# Patient Record
Sex: Female | Born: 1935 | Race: White | Hispanic: No | Marital: Married | Smoking: Never smoker
Health system: Southern US, Community
[De-identification: ages and names within clinical notes are randomized; demographics above are authoritative.]

## PROBLEM LIST (undated history)

## (undated) DIAGNOSIS — J309 Allergic rhinitis, unspecified: Secondary | ICD-10-CM

## (undated) DIAGNOSIS — I509 Heart failure, unspecified: Secondary | ICD-10-CM

## (undated) DIAGNOSIS — M810 Age-related osteoporosis without current pathological fracture: Secondary | ICD-10-CM

## (undated) DIAGNOSIS — K5790 Diverticulosis of intestine, part unspecified, without perforation or abscess without bleeding: Secondary | ICD-10-CM

## (undated) DIAGNOSIS — N390 Urinary tract infection, site not specified: Secondary | ICD-10-CM

## (undated) DIAGNOSIS — D649 Anemia, unspecified: Secondary | ICD-10-CM

## (undated) DIAGNOSIS — E785 Hyperlipidemia, unspecified: Secondary | ICD-10-CM

## (undated) HISTORY — DX: Allergic rhinitis, unspecified: J30.9

## (undated) HISTORY — DX: Diverticulosis of intestine, part unspecified, without perforation or abscess without bleeding: K57.90

## (undated) HISTORY — DX: Heart failure, unspecified: I50.9

## (undated) HISTORY — DX: Hyperlipidemia, unspecified: E78.5

## (undated) HISTORY — DX: Anemia, unspecified: D64.9

## (undated) HISTORY — PX: ROTATOR CUFF REPAIR: SHX139

## (undated) HISTORY — DX: Age-related osteoporosis without current pathological fracture: M81.0

## (undated) HISTORY — DX: Urinary tract infection, site not specified: N39.0

---

## 2004-07-23 ENCOUNTER — Ambulatory Visit: Payer: Self-pay | Admitting: Internal Medicine

## 2005-01-19 ENCOUNTER — Ambulatory Visit: Payer: Self-pay | Admitting: Internal Medicine

## 2005-09-16 ENCOUNTER — Ambulatory Visit: Payer: Self-pay | Admitting: Internal Medicine

## 2006-09-27 ENCOUNTER — Ambulatory Visit: Payer: Self-pay | Admitting: Internal Medicine

## 2008-01-24 ENCOUNTER — Ambulatory Visit: Payer: Self-pay | Admitting: Internal Medicine

## 2009-01-28 ENCOUNTER — Ambulatory Visit: Payer: Self-pay | Admitting: Internal Medicine

## 2009-04-08 ENCOUNTER — Ambulatory Visit: Payer: Self-pay | Admitting: Gastroenterology

## 2010-02-03 ENCOUNTER — Ambulatory Visit: Payer: Self-pay | Admitting: Internal Medicine

## 2011-05-18 ENCOUNTER — Ambulatory Visit: Payer: Self-pay | Admitting: Internal Medicine

## 2012-03-25 ENCOUNTER — Emergency Department: Payer: Self-pay | Admitting: Emergency Medicine

## 2012-06-20 ENCOUNTER — Ambulatory Visit: Payer: Self-pay | Admitting: Internal Medicine

## 2013-06-21 ENCOUNTER — Ambulatory Visit: Payer: Self-pay | Admitting: Family Medicine

## 2013-10-23 ENCOUNTER — Observation Stay: Payer: Self-pay | Admitting: Specialist

## 2013-10-23 LAB — URINALYSIS, COMPLETE
BLOOD: NEGATIVE
Bacteria: NONE SEEN
Bilirubin,UR: NEGATIVE
GLUCOSE, UR: NEGATIVE mg/dL (ref 0–75)
KETONE: NEGATIVE
NITRITE: NEGATIVE
PH: 7 (ref 4.5–8.0)
Protein: NEGATIVE
RBC,UR: <1 /HPF (ref 0–5)
SPECIFIC GRAVITY: 1.021 (ref 1.003–1.030)
Squamous Epithelial: 1
WBC UR: 1 /HPF (ref 0–5)

## 2013-10-23 LAB — COMPREHENSIVE METABOLIC PANEL
ALBUMIN: 3.5 g/dL (ref 3.4–5.0)
ANION GAP: 5 — AB (ref 7–16)
Alkaline Phosphatase: 68 U/L
BUN: 23 mg/dL — ABNORMAL HIGH (ref 7–18)
Bilirubin,Total: 0.4 mg/dL (ref 0.2–1.0)
Calcium, Total: 9 mg/dL (ref 8.5–10.1)
Chloride: 94 mmol/L — ABNORMAL LOW (ref 98–107)
Co2: 31 mmol/L (ref 21–32)
Creatinine: 0.63 mg/dL (ref 0.60–1.30)
EGFR (African American): >60
EGFR (Non-African Amer.): >60
GLUCOSE: 126 mg/dL — AB (ref 65–99)
Osmolality: 266 (ref 275–301)
Potassium: 3.7 mmol/L (ref 3.5–5.1)
SGOT(AST): 29 U/L (ref 15–37)
SGPT (ALT): 23 U/L (ref 12–78)
Sodium: 130 mmol/L — ABNORMAL LOW (ref 136–145)
TOTAL PROTEIN: 7.6 g/dL (ref 6.4–8.2)

## 2013-10-23 LAB — CBC
HCT: 37.6 % (ref 35.0–47.0)
HGB: 12.1 g/dL (ref 12.0–16.0)
MCH: 30.5 pg (ref 26.0–34.0)
MCHC: 32.1 g/dL (ref 32.0–36.0)
MCV: 95 fL (ref 80–100)
PLATELETS: 237 10*3/uL (ref 150–440)
RBC: 3.95 10*6/uL (ref 3.80–5.20)
RDW: 13.3 % (ref 11.5–14.5)
WBC: 13.7 10*3/uL — ABNORMAL HIGH (ref 3.6–11.0)

## 2013-10-23 LAB — LIPASE, BLOOD: Lipase: 140 U/L (ref 73–393)

## 2013-10-24 LAB — CBC WITH DIFFERENTIAL/PLATELET
BASOS PCT: 0.2 %
Basophil #: 0 10*3/uL (ref 0.0–0.1)
EOS PCT: 0.7 %
Eosinophil #: 0.1 10*3/uL (ref 0.0–0.7)
HCT: 32.8 % — ABNORMAL LOW (ref 35.0–47.0)
HGB: 11.1 g/dL — ABNORMAL LOW (ref 12.0–16.0)
LYMPHS ABS: 1.6 10*3/uL (ref 1.0–3.6)
LYMPHS PCT: 15.3 %
MCH: 31.9 pg (ref 26.0–34.0)
MCHC: 33.8 g/dL (ref 32.0–36.0)
MCV: 94 fL (ref 80–100)
MONO ABS: 1 x10 3/mm — AB (ref 0.2–0.9)
Monocyte %: 9.5 %
Neutrophil #: 7.7 10*3/uL — ABNORMAL HIGH (ref 1.4–6.5)
Neutrophil %: 74.3 %
Platelet: 217 10*3/uL (ref 150–440)
RBC: 3.49 10*6/uL — ABNORMAL LOW (ref 3.80–5.20)
RDW: 13.3 % (ref 11.5–14.5)
WBC: 10.4 10*3/uL (ref 3.6–11.0)

## 2013-10-24 LAB — BASIC METABOLIC PANEL
ANION GAP: 6 — AB (ref 7–16)
BUN: 14 mg/dL (ref 7–18)
CALCIUM: 8.1 mg/dL — AB (ref 8.5–10.1)
CHLORIDE: 101 mmol/L (ref 98–107)
CO2: 29 mmol/L (ref 21–32)
Creatinine: 0.57 mg/dL — ABNORMAL LOW (ref 0.60–1.30)
EGFR (Non-African Amer.): >60
Glucose: 96 mg/dL (ref 65–99)
OSMOLALITY: 272 (ref 275–301)
Potassium: 3.6 mmol/L (ref 3.5–5.1)
Sodium: 136 mmol/L (ref 136–145)

## 2013-10-24 LAB — MAGNESIUM: Magnesium: 1.9 mg/dL

## 2013-10-31 DIAGNOSIS — E785 Hyperlipidemia, unspecified: Secondary | ICD-10-CM | POA: Insufficient documentation

## 2013-10-31 DIAGNOSIS — K5710 Diverticulosis of small intestine without perforation or abscess without bleeding: Secondary | ICD-10-CM | POA: Insufficient documentation

## 2013-10-31 DIAGNOSIS — D649 Anemia, unspecified: Secondary | ICD-10-CM | POA: Insufficient documentation

## 2013-10-31 DIAGNOSIS — J309 Allergic rhinitis, unspecified: Secondary | ICD-10-CM | POA: Insufficient documentation

## 2013-10-31 DIAGNOSIS — M810 Age-related osteoporosis without current pathological fracture: Secondary | ICD-10-CM | POA: Insufficient documentation

## 2013-10-31 DIAGNOSIS — I509 Heart failure, unspecified: Secondary | ICD-10-CM | POA: Insufficient documentation

## 2013-11-15 ENCOUNTER — Ambulatory Visit: Payer: Self-pay | Admitting: Gastroenterology

## 2013-11-16 LAB — PATHOLOGY REPORT

## 2013-11-20 ENCOUNTER — Inpatient Hospital Stay: Payer: Self-pay | Admitting: Internal Medicine

## 2013-11-20 LAB — CBC WITH DIFFERENTIAL/PLATELET
BASOS ABS: 0 10*3/uL (ref 0.0–0.1)
Basophil %: 0.1 %
Eosinophil #: 0 10*3/uL (ref 0.0–0.7)
Eosinophil %: 0.1 %
HCT: 33 % — AB (ref 35.0–47.0)
HGB: 11.1 g/dL — ABNORMAL LOW (ref 12.0–16.0)
LYMPHS ABS: 0.9 10*3/uL — AB (ref 1.0–3.6)
Lymphocyte %: 7.2 %
MCH: 31.2 pg (ref 26.0–34.0)
MCHC: 33.6 g/dL (ref 32.0–36.0)
MCV: 93 fL (ref 80–100)
Monocyte #: 0.6 x10 3/mm (ref 0.2–0.9)
Monocyte %: 4.5 %
NEUTROS PCT: 88.1 %
Neutrophil #: 10.9 10*3/uL — ABNORMAL HIGH (ref 1.4–6.5)
Platelet: 378 10*3/uL (ref 150–440)
RBC: 3.55 10*6/uL — ABNORMAL LOW (ref 3.80–5.20)
RDW: 14 % (ref 11.5–14.5)
WBC: 12.4 10*3/uL — AB (ref 3.6–11.0)

## 2013-11-20 LAB — COMPREHENSIVE METABOLIC PANEL
ALK PHOS: 57 U/L
ANION GAP: 7 (ref 7–16)
AST: 26 U/L (ref 15–37)
Albumin: 2 g/dL — ABNORMAL LOW (ref 3.4–5.0)
BUN: 17 mg/dL (ref 7–18)
Bilirubin,Total: 0.4 mg/dL (ref 0.2–1.0)
CALCIUM: 8 mg/dL — AB (ref 8.5–10.1)
CHLORIDE: 95 mmol/L — AB (ref 98–107)
CREATININE: 0.75 mg/dL (ref 0.60–1.30)
Co2: 29 mmol/L (ref 21–32)
Glucose: 159 mg/dL — ABNORMAL HIGH (ref 65–99)
Osmolality: 268 (ref 275–301)
POTASSIUM: 3.1 mmol/L — AB (ref 3.5–5.1)
SGPT (ALT): 17 U/L (ref 12–78)
SODIUM: 131 mmol/L — AB (ref 136–145)
TOTAL PROTEIN: 6 g/dL — AB (ref 6.4–8.2)

## 2013-11-20 LAB — URINALYSIS, COMPLETE
BILIRUBIN, UR: NEGATIVE
Glucose,UR: NEGATIVE mg/dL (ref 0–75)
Nitrite: NEGATIVE
PROTEIN: NEGATIVE
Ph: 6 (ref 4.5–8.0)
RBC, UR: NONE SEEN /HPF (ref 0–5)
Specific Gravity: 1.034 (ref 1.003–1.030)
Squamous Epithelial: 2

## 2013-11-20 LAB — OCCULT BLOOD X 1 CARD TO LAB, STOOL: OCCULT BLOOD, FECES: NEGATIVE

## 2013-11-20 LAB — CLOSTRIDIUM DIFFICILE(ARMC)

## 2013-11-21 LAB — CBC WITH DIFFERENTIAL/PLATELET
Basophil #: 0 10*3/uL (ref 0.0–0.1)
Basophil %: 0.1 %
EOS ABS: 0 10*3/uL (ref 0.0–0.7)
EOS PCT: 0.3 %
HCT: 27 % — ABNORMAL LOW (ref 35.0–47.0)
HGB: 9.1 g/dL — ABNORMAL LOW (ref 12.0–16.0)
Lymphocyte #: 0.9 10*3/uL — ABNORMAL LOW (ref 1.0–3.6)
Lymphocyte %: 9.2 %
MCH: 31.3 pg (ref 26.0–34.0)
MCHC: 33.7 g/dL (ref 32.0–36.0)
MCV: 93 fL (ref 80–100)
MONO ABS: 0.5 x10 3/mm (ref 0.2–0.9)
MONOS PCT: 5.5 %
Neutrophil #: 8.3 10*3/uL — ABNORMAL HIGH (ref 1.4–6.5)
Neutrophil %: 84.9 %
Platelet: 301 10*3/uL (ref 150–440)
RBC: 2.91 10*6/uL — AB (ref 3.80–5.20)
RDW: 14.1 % (ref 11.5–14.5)
WBC: 9.7 10*3/uL (ref 3.6–11.0)

## 2013-11-21 LAB — BASIC METABOLIC PANEL
Anion Gap: 7 (ref 7–16)
BUN: 11 mg/dL (ref 7–18)
Calcium, Total: 7.4 mg/dL — ABNORMAL LOW (ref 8.5–10.1)
Chloride: 101 mmol/L (ref 98–107)
Co2: 27 mmol/L (ref 21–32)
Creatinine: 0.64 mg/dL (ref 0.60–1.30)
EGFR (African American): >60
Glucose: 81 mg/dL (ref 65–99)
Osmolality: 269 (ref 275–301)
Potassium: 3.1 mmol/L — ABNORMAL LOW (ref 3.5–5.1)
SODIUM: 135 mmol/L — AB (ref 136–145)

## 2013-11-21 LAB — TSH: THYROID STIMULATING HORM: 0.307 u[IU]/mL — AB

## 2013-11-23 LAB — CBC WITH DIFFERENTIAL/PLATELET
Bands: 16 %
COMMENT - H1-COM2: NORMAL
COMMENT - H1-COM3: NORMAL
EOS PCT: 1 %
HCT: 31.5 % — ABNORMAL LOW (ref 35.0–47.0)
HGB: 10.4 g/dL — ABNORMAL LOW (ref 12.0–16.0)
LYMPHS PCT: 8 %
MCH: 30.7 pg (ref 26.0–34.0)
MCHC: 32.9 g/dL (ref 32.0–36.0)
MCV: 93 fL (ref 80–100)
MONOS PCT: 1 %
MYELOCYTE: 7 %
Metamyelocyte: 6 %
Platelet: 337 10*3/uL (ref 150–440)
Promyelocyte: 1 %
RBC: 3.37 10*6/uL — ABNORMAL LOW (ref 3.80–5.20)
RDW: 14.2 % (ref 11.5–14.5)
Segmented Neutrophils: 60 %
WBC: 4.9 10*3/uL (ref 3.6–11.0)

## 2013-11-23 LAB — BASIC METABOLIC PANEL
Anion Gap: 6 — ABNORMAL LOW (ref 7–16)
BUN: 6 mg/dL — AB (ref 7–18)
Calcium, Total: 7.7 mg/dL — ABNORMAL LOW (ref 8.5–10.1)
Chloride: 104 mmol/L (ref 98–107)
Co2: 26 mmol/L (ref 21–32)
Creatinine: 0.52 mg/dL — ABNORMAL LOW (ref 0.60–1.30)
EGFR (African American): >60
Glucose: 147 mg/dL — ABNORMAL HIGH (ref 65–99)
Osmolality: 272 (ref 275–301)
Potassium: 4.8 mmol/L (ref 3.5–5.1)
Sodium: 136 mmol/L (ref 136–145)

## 2013-11-23 LAB — STOOL CULTURE

## 2013-11-26 LAB — T4, FREE: Free Thyroxine: 1.28 ng/dL (ref 0.76–1.46)

## 2013-12-20 ENCOUNTER — Inpatient Hospital Stay: Payer: Self-pay | Admitting: Internal Medicine

## 2013-12-20 LAB — CBC WITH DIFFERENTIAL/PLATELET
Basophil #: 0 10*3/uL (ref 0.0–0.1)
Basophil %: 0.1 %
Eosinophil #: 0 10*3/uL (ref 0.0–0.7)
Eosinophil %: 0.2 %
HCT: 28.7 % — ABNORMAL LOW (ref 35.0–47.0)
HGB: 9.4 g/dL — ABNORMAL LOW (ref 12.0–16.0)
LYMPHS ABS: 1.3 10*3/uL (ref 1.0–3.6)
LYMPHS PCT: 9.1 %
MCH: 30.1 pg (ref 26.0–34.0)
MCHC: 32.6 g/dL (ref 32.0–36.0)
MCV: 92 fL (ref 80–100)
Monocyte #: 1 x10 3/mm — ABNORMAL HIGH (ref 0.2–0.9)
Monocyte %: 6.9 %
NEUTROS PCT: 83.7 %
Neutrophil #: 12.4 10*3/uL — ABNORMAL HIGH (ref 1.4–6.5)
Platelet: 470 10*3/uL — ABNORMAL HIGH (ref 150–440)
RBC: 3.11 10*6/uL — ABNORMAL LOW (ref 3.80–5.20)
RDW: 15.1 % — ABNORMAL HIGH (ref 11.5–14.5)
WBC: 14.8 10*3/uL — ABNORMAL HIGH (ref 3.6–11.0)

## 2013-12-20 LAB — COMPREHENSIVE METABOLIC PANEL
AST: 22 U/L (ref 15–37)
Albumin: 2 g/dL — ABNORMAL LOW (ref 3.4–5.0)
Alkaline Phosphatase: 71 U/L
Anion Gap: 4 — ABNORMAL LOW (ref 7–16)
BUN: 31 mg/dL — ABNORMAL HIGH (ref 7–18)
Bilirubin,Total: 0.3 mg/dL (ref 0.2–1.0)
CHLORIDE: 97 mmol/L — AB (ref 98–107)
Calcium, Total: 8.8 mg/dL (ref 8.5–10.1)
Co2: 32 mmol/L (ref 21–32)
Creatinine: 0.87 mg/dL (ref 0.60–1.30)
EGFR (Non-African Amer.): >60
GLUCOSE: 111 mg/dL — AB (ref 65–99)
Osmolality: 274 (ref 275–301)
Potassium: 3.8 mmol/L (ref 3.5–5.1)
SGPT (ALT): 18 U/L (ref 12–78)
Sodium: 133 mmol/L — ABNORMAL LOW (ref 136–145)
Total Protein: 6.1 g/dL — ABNORMAL LOW (ref 6.4–8.2)

## 2013-12-20 LAB — CLOSTRIDIUM DIFFICILE(ARMC)

## 2013-12-20 LAB — PROTIME-INR
INR: 1.1
PROTHROMBIN TIME: 14.1 s (ref 11.5–14.7)

## 2013-12-21 LAB — BASIC METABOLIC PANEL
Anion Gap: 8 (ref 7–16)
BUN: 23 mg/dL — ABNORMAL HIGH (ref 7–18)
CALCIUM: 7.8 mg/dL — AB (ref 8.5–10.1)
CREATININE: 0.61 mg/dL (ref 0.60–1.30)
Chloride: 97 mmol/L — ABNORMAL LOW (ref 98–107)
Co2: 26 mmol/L (ref 21–32)
EGFR (African American): >60
EGFR (Non-African Amer.): >60
Glucose: 177 mg/dL — ABNORMAL HIGH (ref 65–99)
Osmolality: 271 (ref 275–301)
Potassium: 3.7 mmol/L (ref 3.5–5.1)
Sodium: 131 mmol/L — ABNORMAL LOW (ref 136–145)

## 2013-12-21 LAB — CBC WITH DIFFERENTIAL/PLATELET
Basophil #: 0 10*3/uL (ref 0.0–0.1)
Basophil %: 0.1 %
Eosinophil #: 0 10*3/uL (ref 0.0–0.7)
Eosinophil %: 0 %
HCT: 26.2 % — ABNORMAL LOW (ref 35.0–47.0)
HGB: 8.6 g/dL — ABNORMAL LOW (ref 12.0–16.0)
Lymphocyte #: 0.6 10*3/uL — ABNORMAL LOW (ref 1.0–3.6)
Lymphocyte %: 4.2 %
MCH: 30.2 pg (ref 26.0–34.0)
MCHC: 32.9 g/dL (ref 32.0–36.0)
MCV: 92 fL (ref 80–100)
Monocyte #: 0.4 x10 3/mm (ref 0.2–0.9)
Monocyte %: 2.7 %
Neutrophil #: 14.2 10*3/uL — ABNORMAL HIGH (ref 1.4–6.5)
Neutrophil %: 93 %
Platelet: 403 10*3/uL (ref 150–440)
RBC: 2.86 10*6/uL — ABNORMAL LOW (ref 3.80–5.20)
RDW: 15.1 % — ABNORMAL HIGH (ref 11.5–14.5)
WBC: 15.3 10*3/uL — ABNORMAL HIGH (ref 3.6–11.0)

## 2013-12-21 LAB — MAGNESIUM: Magnesium: 1.9 mg/dL

## 2013-12-21 LAB — TSH: Thyroid Stimulating Horm: 0.247 u[IU]/mL — ABNORMAL LOW

## 2013-12-21 LAB — T4, FREE: Free Thyroxine: 1.35 ng/dL (ref 0.76–1.46)

## 2013-12-22 LAB — STOOL CULTURE

## 2013-12-25 LAB — CULTURE, BLOOD (SINGLE)

## 2014-03-16 DIAGNOSIS — K5730 Diverticulosis of large intestine without perforation or abscess without bleeding: Secondary | ICD-10-CM | POA: Insufficient documentation

## 2014-08-21 ENCOUNTER — Ambulatory Visit: Payer: Self-pay | Admitting: Family Medicine

## 2014-11-02 NOTE — Consult Note (Signed)
PATIENT NAME:  Carrie [name], Carrie [name] MR#:  [id number] DATE OF BIRTH:  Jun 27, 1930  DATE OF CONSULTATION:  12/21/2013  CONSULTING PHYSICIAN:  Loraine LericheMark A. Egbert GaribaldiBird, MD  HISTORY OF PRESENT ILLNESS:  This is an 81 year old [place] female recently diagnosed with nonspecific colitis approximately 2 months ago. She has been in the hospital now 3 times with recalcitrant colitis, been treated with intravenous antibiotics, intravenous steroids and oral steroids. Several colonoscopies have been performed, which demonstrate nonspecific-appearing colitis. Pathology is consistent with colitis. She has been treated presumptively for ulcerative colitis. The patient had then been readmitted to the hospital yesterday with recurrent abdominal pain and failure of outpatient therapy. A CT scan was performed which demonstrates persistent left-sided colitis, as well as pericolonic abscess. Surgical services were asked to comment regarding surgical therapy.  Allergies and medications are well defined in the chart.   PAST MEDICAL HISTORY: Diverticulosis, possible colitis, possible ulcerative colitis, arthritis, restless leg syndrome, osteoporosis, osteoarthritis.   ALLERGIES: AZITHROMYCIN AND NITROFURANTOIN.   SOCIAL HISTORY: Does not drink, does not smoke. No drug use. Is married. Family is in the room.   FAMILY HISTORY:  Significant for hypertension, diabetes and cancer.   REVIEW OF SYSTEMS: Significant for a 10 pound weight loss, poor p.o. intake, fatigue, abdominal pain, chronic blurry vision, depression, failure to thrive, as well as bloody diarrhea.   PHYSICAL EXAMINATION: GENERAL:  This is an ill-appearing [place] female in no apparent distress.  VITAL SIGNS: Temperature is 98.6, pulse of 101. Blood pressure is 146/76, O2 sat on room air is 96%.  LUNGS:  Clear.  HEART:  Regular rate and rhythm.  ABDOMEN:  Tender in the left lower quadrant, mildly distended. No scars. No hernias. No masses.  RECTAL:  Deferred.  NEUROLOGIC AND  PSYCHIATRIC:  Unremarkable.   LABORATORY VALUES:  Glucose 177, BUN 23, creatinine 0.61, sodium 131, potassium 3.7, chloride 97, CO2 of 26, magnesium 1.9. TSH is 0.247. Kimbrell count is 15.3, hemoglobin 8.6, platelet count 403,000 with left shift. Urinalysis not obtained. Blood cultures are negative. C. diff is negative. Review of CT scan is as described above. Previous CT scans demonstrate left-sided colitis.   IMPRESSION: Left-sided colitis with contained perforation. It is unclear to me what the perforation is coming from, either from necrotic segment of colon or diverticular disease. Clearly, the patient has failed medical therapy. There is no indication at this portion for Remicade, if, indeed, this is even going to be treated for ulcerative colitis. Further steroid therapy in the setting of an abscess would be, in my mind, a moderate contraindication.   RECOMMENDATIONS: I discussed with the family, as well as gastroenterology, the possibility of a diverting colostomy with resection, i.e., Hartmann procedure.  I will let the family discuss this amongst themselves, and I will see the patient in the morning.   total time spent 65 minutes.  ____________________________ Redge GainerMark A. Egbert GaribaldiBird, MD mab:dmm D: 12/21/2013 17:17:15 ET T: 12/21/2013 19:07:52 ET JOB#: [id number]  cc: Loraine LericheMark A. Egbert GaribaldiBird, MD, <Dictator> cc Dr Renae Ficklepaul Barrington Ellisonh, [facility]. Burnice Vassel A Tersea Aulds MD ELECTRONICALLY SIGNED 12/23/2013 11:52

## 2014-11-02 NOTE — Consult Note (Signed)
PATIENT NAME:  Carrie [name], Marvis K MR#:  [id number] DATE OF BIRTH:  1930/07/11  DATE OF CONSULTATION:  10/24/2013  REFERRING PHYSICIAN:   CONSULTING PHYSICIAN:  Rodman Keyawn S. Harrison, NP  PRIMARY CARE PHYSICIAN:  Kandyce RudMarcus Babaoff, MD  ATTENDING:  Shaune PollackQing Chen, MD  REASON FOR CONSULTATION: Diverticulitis.   HISTORY OF PRESENT ILLNESS:  Carrie [name] is a very pleasant 81 year old Caucasian female with a history of diverticulosis, arthritis. [facility] on yesterday's date with a 3-day history of abdominal pain, pain to the right lower quadrant of her abdomen intermittent, achy in description. Denies radiating anywhere. Her pain was a 5 on a scale of 1 to 10. No nausea, no vomiting. Significant for diarrhea. States diarrhea started yesterday and did experience some evidence of mild rectal bleeding which she felt was possibly related to hemorrhoids. Normally, her bowels move on average every day. No melena. Abdominal pain has resolved at this time since being admitted and started on antibiotic therapy. She did have a CT scan of abdomen and pelvis which was done yesterday evening, which revealed a tubular structure posterior to the cecum, thought to represent an appendix measuring 6 mm in diameter. The cecum and right colon were mildly distended and there were scattered colonic diverticula more concentrated in the sigmoid colon where there is also a region of colon wall thickening. The colon vicinity seems abnormally thick, greater than would be expected for diverticulosis. No adnexal mass was observed. The uterus is unremarkable. There is a trace of free pelvic fluid and a small amount of gas in the urinary bladder noted. Also evidence of lumbar spondylosis and degenerative disk disease. She has been experiencing some heartburn and does experience heartburn at home. No fevers. No nausea, no vomiting. Appetite has been decreased for the past week prior to admission. She did have a colonoscopy which was  performed by Dr. Lutricia FeilPaul Oh on 04/08/2009 with evidence of diverticulosis involving sigmoid colon, evidence of nonthrombosed internal hemorrhoids.   ALLERGIES: ZITHROMAX AND MACROBID.   HOME MEDICATIONS: Percocet 325/5 mg 1 tablet twice a day, aspirin 81 mg a day, calcium 600 mg twice a day, Centrum silver multivitamin once a day, Zyrtec 10 mg once a day as needed for allergies, cranberry capsules 6 capsules once a day, Evista 60 mg once a day, meloxicam 15 mg once a day, pramipexole 0.25 mg at bedtime, Robaxin 500 mg 4 times daily as needed, vitamin C 500 mg daily, vitamin E 400 international units 2 capsules once a day.   PAST MEDICAL HISTORY: Diverticulosis, arthritis. Congestive heart failure secondary to severe gram-negative pneumonia. She also had some mild systolic dysfunction June 1996. History of mild systolic dysfunction by echocardiogram at that time. Perennial allergic rhinitis, history of monocystic normochromic anemia, mild; hyperlipidemia, corneal disease and mild osteoporosis.   PAST SURGICAL HISTORY: Bilateral cataracts implantation, a cyst removal from finger, colonoscopy in 2010 and right rotator cuff 2006.   FAMILY HISTORY: Mom oral mouth cancer. Sister lung cancer. Brother lung cancer. Uncle stomach cancer.   SOCIAL HISTORY: No tobacco. No alcohol use.   REVIEW OF SYSTEMS: All 10 systems reviewed and checked, otherwise unremarkable other than what is stated above except for the fact that she has suffered a traumatic injury to her right shoulder a few weeks ago is concerned with possible reinjury of rotator cuff.   PHYSICAL EXAMINATION: VITAL SIGNS: Temperature 98.7, pulse 96, respirations 18, blood pressure 118/69 with a pulse ox 97% on room air.  GENERAL: Well-developed,  well-nourished 81 year old Caucasian female, no acute distress noted. Pleasant, in fact appearing very comfortably sitting in bed, normal clothes. Family and friends at bedside.  HEENT: Normocephalic,  atraumatic. Pupils equal, reactive to light. Conjunctivae clear. Sclerae anicteric.  NECK: Supple. Trachea midline. No lymphadenopathy or thyromegaly.  PULMONARY: Symmetric rise and fall of chest. Clear to auscultation throughout.  CARDIOVASCULAR: Regular rhythm, S1, S2. No murmurs, no gallops.  ABDOMEN: Soft, nondistended. Bowel sounds in all quadrants. No bruits. No masses. No evidence of hepatosplenomegaly.  RECTAL: Deferred.  MUSCULOSKELETAL: Moving all 4 extremities. No contractures. No clubbing.  EXTREMITIES: No edema.  PSYCHIATRIC: Alert and oriented x 4. Memory grossly intact. Appropriate affect and mood.  SKIN: Warm, dry. No lesions. No rashes.   LABORATORY, DIAGNOSTIC AND RADIOLOGICAL DATA: Glucose was elevated at 126 on admission, BUN was 23, sodium 130 and corrected to 136, chloride 94 and corrected to 101 at this time. Hepatic panel within normal limits. CBC: WBC count was elevated at 13.7 on admission, hemoglobin was 12.1 with hematocrit of 37.6 which now appears to be mildly anemic at 11.1 and hematocrit of 32.8. Urinalysis within normal limits. Lactic acid level was 1.3. CT scan results as stated above.   IMPRESSION:  Acute onset of right lower quadrant abdominal pain, abnormal CT scan findings inclusive of cecum as well as sigmoid colonic area. Diagnosed with acute diverticulitis. Known history of diverticulosis.   PLAN: The patient's presentation was discussed with Dr. Lutricia Feil. The patient placed on clear liquid diet at this time after he had spoken with Dr. Cherlynn Kaiser as well. She is currently receiving antibiotic therapy inclusive of ciprofloxacin and metronidazole. Plan is for the patient to be discharged on antibiotic therapy if her prognosis but continues to improve. Goal is for the patient to be discharged tomorrow for her to follow up on an outpatient basis and to discuss at that time proceeding forward with a diagnostic colonoscopy based on CT scan findings. This was discussed  with the patient and the patient is in agreement to proceed forward in this manner. We will continue to monitor.   These services provided by Rodman Key, MS, APRN, [facility], FNP, under collaborative agreement with Dr. Lutricia Feil.    ____________________________ Rodman Key, NP dsh:cs D: 10/24/2013 13:50:00 ET T: 10/24/2013 14:07:17 ET JOB#: [id number]  cc: Rodman Key, NP, <Dictator> Rodman Key MD ELECTRONICALLY SIGNED 10/24/2013 16:49

## 2014-11-02 NOTE — Consult Note (Signed)
Pt depressed and teary eyed. Mild tenderness in LLQ abd area. Pt to have surgery tomorrow AM. Since patient has abscess, would taper off IV steroids. Continue IV Abx. Will check back after surgery. thanks.  Electronic Signatures: Lutricia Feilh, Cordell Coke (MD)  (Signed on 13-Jun-15 10:05)  Authored  Last Updated: 13-Jun-15 10:05 by Lutricia Feilh, Aziyah Provencal (MD)

## 2014-11-02 NOTE — Discharge Summary (Signed)
PATIENT NAME:  Carrie [name], Carrie [name] MR#:  [id number] DATE OF BIRTH:  1929/09/25  DATE OF ADMISSION:  11/20/2013 DATE OF DISCHARGE:  11/26/2013  DISCHARGE DIAGNOSES:  1. Acute colitis, now improving, tolerating diet.  2. Hypokalemia, repleted and resolved.   SECONDARY DIAGNOSES:  1. History of diverticulitis.  2. Arthritis.  3. Restless leg syndrome.  4. Osteoporosis.  5. Osteoarthritis.   CONSULTATIONS:  1. GI, Dr. Lutricia Feil.  2. Physical therapy.   PROCEDURES AND RADIOLOGY: CT scan of the abdomen and pelvis with contrast on the 12th of May showed colitis. Contracted gallbladder without calcified gallstone. Dilated common bile duct without pancreatic mass. Splenic artery aneurysm measuring up to 1.7 cm.   Chest x-ray on the 14th of May showed small bilateral pleural effusion. Repeat chest x-ray was unchanged with small bilateral pleural effusions and bibasilar opacities, likely atelectasis.   MAJOR LABORATORY PANEL:  1. UA admission was negative.  2. Stool for C. difficile was negative. Stool comprehensive culture was negative.  3. Hemoccult stool was negative.  4. Free T4 was within normal limits with a value of 1.28. Serum free T3 was low with a value of 1.3. TSH was low with a value of 0.307.   HISTORY AND SHORT HOSPITAL COURSE: The patient is an 81 year old female with above-mentioned medical problems who was admitted for abdominal pain. Underwent CT scan of the abdomen which showed possible colitis. Please see Dr. Serita Grit Patel's dictated history and physical for further details. GI consultation was obtained with Dr. Bluford Kaufmann who recommended continuing antibiotics IV form and starting IV steroids concerning history of ulcerative colitis. The patient was slowly improving on above-mentioned regimen and was able to tolerate diet, and her abdominal pain was significantly better by the 18th of May. She had some low potassium which was repleted and resolved. She was close to her baseline on the 18th of  May and was discharged home in stable condition.   VITAL SIGNS: On the date of discharge, her vital signs were as follows: Temperature 98.8, heart rate 95 per minute, respirations 20 per minute, blood pressure 122/72 mmHg. She was saturating 93% on room air.   PERTINENT PHYSICAL EXAMINATION ON THE DATE OF DISCHARGE:  CARDIOVASCULAR: S1, S2 normal. No murmurs, rubs or gallop.  LUNGS: Clear to auscultation bilaterally. No wheezing, rales, rhonchi or crepitation.  ABDOMEN: Soft, benign.  NEUROLOGIC: Nonfocal examination.   All other physical examination remained at baseline.   DISCHARGE MEDICATIONS:  1. Pramipexole 0.25 mg p.o. once at bedtime.  2. Meloxicam 15 mg p.o. daily.  3. Calcium with vitamin D 1 tablet p.o. b.i.d.  4. Centrum Silver once daily.  5. Cetirizine 10 mg p.o. daily as needed.  6. Evista 60 mg p.o. daily.  7. Vitamin E 400 international units 2 capsules daily.  8. Vitamin C 500 mg p.o. daily.  9. Robaxin 500 mg p.o. 4 times a day as needed.  10. Acetaminophen/hydrocodone 325/5 mg 1 tablet p.o. b.i.d.  11. Cranberry 2 capsules p.o. after each meal.  12. Muro 128 one drop to each affected eye 4 times a day.  13. Flagyl 500 mg p.o. every 8 hours for 10 days.  14. Prednisone 60 mg p.o. daily, taper 10 mg each week until finished.  15. Ciprofloxacin 500 mg p.o. b.i.d. for 10 days.   DISCHARGE DIET: Low fat, low cholesterol. Eat light for the first few meals.   DISCHARGE ACTIVITY: As tolerated.   DISCHARGE INSTRUCTIONS AND FOLLOWUP:  1. The patient was instructed  to follow up with her primary care physician, Dr. Larwance SachsBabaoff, [facility] as scheduled on the 29th of May.  2. She will follow up with Dr. Lutricia FeilPaul Oh from GI in 2 to 3 weeks.   TOTAL TIME DISCHARGING THIS PATIENT: 55 minutes.   ____________________________ Ellamae SiaVipul S. Sherryll BurgerShah, MD vss:gb D: 11/29/2013 19:30:07 ET T: 11/30/2013 00:53:08 ET JOB#: [id number]  cc: Inessa Wardrop S. Sherryll BurgerShah, MD, <Dictator> Kandyce RudMarcus Babaoff,  MD Ezzard StandingPaul Y. Bluford Kaufmannh, MD Patricia PesaVIPUL S Jamee Keach MD ELECTRONICALLY SIGNED 12/04/2013 04:5420:08

## 2014-11-02 NOTE — H&P (Signed)
PATIENT NAME:  Carrie [name], Carrie [name] MR#:  [id number] DATE OF BIRTH:  [birth year]  DATE OF ADMISSION:  11/20/2013  PRIMARY CARE PROVIDER: Dr. Baldemar Lenis   ED REFERRING PHYSICIAN: Dr. Jimmye Norman  CHIEF COMPLAINT: Abdominal pain.    HISTORY OF PRESENT ILLNESS: The patient is an 81 year old [place] female who was hospitalized on April 14 with the diagnosis of acute diverticulitis. At that time, she was treated with IV antibiotics and subsequently discharged home. Then she was seen by outpatient GI, and on May 7 underwent a colonoscopy, which was limited up to only the sigmoid area due to significant inflammation in her rectum. The patient was told to be treated with Flagyl and Asacol. The pathology results from that showed acute colitis. She presented with worsening abdominal pain. A CT scan done today shows significant inflammation involving the rectum and the colon. Therefore, we were asked to admit the patient. The patient reports that her appetite has been okay. She has not had any nausea, vomiting or diarrhea. Denies any hematemesis or hematochezia. The pain is mostly located in the left lower quadrant, described as a sharp type of pain. She denies any fevers or chills.   PAST MEDICAL HISTORY: History of diverticulitis, history of arthritis, history of restless legs syndrome, osteoporosis, osteoarthritis.  ALLERGIES: AZITHROMYCIN, NITROFURANTOIN.  MEDICATIONS AT HOME: Vitamin E 4000 international units 2 caps daily, vitamin C 500 mg 1 tab p.o. daily, Robaxin 509, 1 tab 4 times a day, pramipexole 0.25 at bedtim1e, Miro 128, 1 drop to affected eye daily, Flagyl 500 mg 1 tab p.o. q. 8, meloxicam 15 mg daily, Evista 60 mg 1 tab p.o. daily, cranberry 6 caps daily, cetirizine 10 daily as needed, Centrum Silver 1 tab p.o. daily, calcium Plus Vitamin D 1 tab p.o. b.i.d., Asacol 800, 2 tabs t.i.d., acetaminophen/ hydrocodone 325/5 mg 1 tab p.o. b.i.d. as needed for pain.   SOCIAL HISTORY: No smoking, drinking, or illicit  drug use.   FAMILY HISTORY: Positive for hypertension, diabetes.  REVIEW OF SYSTEMS:   CONSTITUTIONAL: Denies any fevers. Complains of fatigue, weakness, abdominal pain. No weight loss. No weight gain.  EYES: No blurred or double vision. No pain. No redness or inflammation.  ENT: No tinnitus. No ear pain. No hearing loss. No allergies, seasonal or year-round allergies. No difficulty with swallowing.  RESPIRATORY: Denies any cough, wheezing, hemoptysis. No dyspnea.  CARDIOVASCULAR: Denies any chest pain, orthopnea, edema, or arrhythmia.  GASTROINTESTINAL: Denies any nausea, vomiting or diarrhea. Complains of abdominal pain in the left lower quadrant. No hematemesis. No melena.  GENITOURINARY: Denies any dysuria, hematuria, renal calculus or frequency.  ENDOCRINE: Denies any polydipsia or nocturia or thyroid problems.  HEMATOLOGIC/LYMPHATIC: Denies anemia, easy bruisability, or bleeding.  SKIN: No acne or rash. No changes in mole, hair or skin.  MUSCULOSKELETAL: Has osteoarthritis and pain related to that. No gout.  NEUROLOGIC: No numbness, weakness. Has restless legs syndrome.  PSYCHIATRIC: No anxiety, insomnia.  PHYSICAL EXAMINATION: VITAL SIGNS: Temperature 98.1, pulse 100, respirations 18, blood pressure 121/53, O2 of 97%.  GENERAL: The patient is a well-developed female in no acute distress.  HEENT: Head atraumatic, normocephalic. Pupils equally round, react to light and accommodation. There is no conjunctival pallor. No scleral icterus. Nasal exam shows no drainage or ulceration. Oropharynx is clear, without any exudate.  NECK: Supple, without any JVD.  CARDIOVASCULAR: Regular rate and rhythm. No murmurs, rubs, clicks, or gallops.  LUNGS: Clear to auscultation bilaterally without any rales, rhonchi, wheezing.  ABDOMEN: Left lower quadrant tenderness.  No guarding or rebound.  EXTREMITIES: No clubbing, cyanosis, edema.  SKIN: No rash.  LYMPHATICS: No lymph nodes palpable.  VASCULAR:  Good DP, PT pulses.  PSYCHIATRIC: Not anxious or depressed.  NEUROLOGICAL: Awake, alert, oriented x 3. No focal deficits.   EVALUATIONS: CT scan of the abdomen and pelvis shows significant inflammation of the descending colon and sigmoid colon. No abscess. Labs:  Glucose 159, BUN 17, creatinine 0.75, sodium 131, potassium 3.1, chloride 95, CO2 is 29, calcium 8.0. LFTs: Total protein 6.0, albumin 2.0, bili total 0.4, alk phos 57, AST 26, ALT 17. WBC 12.4, hemoglobin 11.1, platelet count 378. Urinalysis:  Leukocytes 1+, nitrites negative.   ASSESSMENT AND PLAN: The patient is an 81 year old [place] female with recent admission for possible diverticulitis. Has severe inflammation involving her rectum and colon.   1.  Abdominal pain due to colitis. Has been on Cipro and Flagyl. At this time, I will treat her with Invanz. Will try to send stool if she had diarrhea, but currently no diarrhea. Also, she is on Asacol for possible inflammatory bowel disease, which will continue that. Will have GI consult on the patient for further recommendations.  2.  Hypokalemia. Will place potassium in her IV fluids.   3.  Miscellaneous. The patient will be on heparin for deep vein thrombosis prophylaxis.  TIME SPENT: 50 minutes on this H and P.    ____________________________ Chana Bode H. Posey Pronto, MD shp:mr D: 11/20/2013 19:42:08 ET T: 11/20/2013 20:22:09 ET JOB#: [id number]  cc: Laval Cafaro H. Posey Pronto, MD, <Dictator> Alric Seton MD ELECTRONICALLY SIGNED 11/26/2013 12:13

## 2014-11-02 NOTE — Consult Note (Signed)
Carrie [name] NAME:  Carrie Carrie [name], Carrie Carrie [name] MR#:  [id number] DATE OF BIRTH:  [birth year]  DATE OF CONSULTATION:  11/21/2013  REFERRING PHYSICIAN:  Dr. Eliane Decree CONSULTING PHYSICIAN:  Carrie Carrie [name]. Carrie Kaufmann, MD; Carrie Key, NP PRIMARY CARE PHYSICIAN: [name], MD  REASON FOR CONSULTATION: Colitis.   HISTORY OF PRESENT ILLNESS: Carrie Carrie [name] is an 81 year old Caucasian female who is known to myself as well as Dr. Lutricia Feil. Carrie Carrie [name] was hospitalized on February 22, 2014, for Carrie diagnosis of acute diverticulitis. At that time, she was treated with IV antibiotics and subsequently was discharged home. She underwent endoscopic evaluation by Dr. Bluford Carrie [name] via colonoscopy on Nov 15, 2013, with finding of many small largemouth diverticula found in Carrie sigmoid colon. Diffuse severe inflammation characterized by erythema and friability was found in Carrie rectum. Biopsies were taken with cold forceps for histology. Results revealed evidence of moderate active colitis in Carrie form of cryptitis and crypt abscess formation. Chronicity was not present. Differential inclusive of infectious, ischemic, inflamed diverticula; drug-induced or early idiopathic inflammatory bowel disease. Based on Carrie findings of pathology, Carrie Carrie [name] was prescribed ciprofloxacin 500 mg p.o. b.i.d. as well as metronidazole 500 mg t.i.d. this past Friday on an outpatient basis, as well as started on Asacol HD 800 mg tablet with directions of 2 tablets p.o. t.i.d. She was to notify our office Wednesday of this week, i.e. today. Carrie Carrie [name]'s husband had notified our office yesterday of her symptoms being worse. Abdominal pain was not improving, was at least a 6 on a scale of 1 to 10. Pain was sharp in nature. She was with very poor p.o. intake. Associated nausea with gagging at times. Denies any fevers and no rectal bleeding. Carrie Carrie [name] was recommended thus to present to Carrie Emergency Room for Carrie concern of dehydration on top of Carrie findings of colitis, which appear to have  not been improving with outpatient antibiotic therapy. Thus, she presented yesterday evening and obviously admitted.   PAST MEDICAL HISTORY: Diverticulitis, history of arthritis, restless leg syndrome, osteoporosis and osteoarthritis.   ALLERGIES: ERYTHROMYCIN AND MACRODANTIN.   HOME MEDICATIONS: Vitamin E 1000 international units 2 capsules daily, vitamin C 500 mg 1 tablet p.o. b.i.d., Robaxin 500 mg 1 tablet 4 times a day, pramipexole 0.25 mg at bedtime, Muro 128 one drop to each affected eye daily, metronidazole 500 mg 1 tablet p.o. t.i.d., meloxicam 15 mg daily, Evista 60 mg once a day, cranberry 6 capsules daily, Zyrtec 10 mg 1 tablet daily as needed, Centrum Silver 1 tablet daily, calcium with vitamin D 1 tablet twice a day, Asacol HD 800 mg 2 tablets t.i.d., acetaminophen with hydrocodone 5/325 mg tablet 1 tablet b.i.d. as needed for pain.   SOCIAL HISTORY: No tobacco. No alcohol or recreational drug use.   FAMILY HISTORY: Significant for hypertension, diabetes.   REVIEW OF SYSTEMS: CONSTITUTIONAL: Significant for fatigue, weakness. Denies any fevers, weight loss, weight gain.  EYES: No blurred vision, double vision.  ENT: No tinnitus, ear pain, hearing loss.  RESPIRATORY: No coughing. No wheezing, hemoptysis.  CARDIOVASCULAR: No chest pain, heart palpitations.  GASTROINTESTINAL: See HPI.  GENITOURINARY: No dysuria or hematuria.  ENDOCRINE: No polydipsia or nocturia.  HEMATOLOGIC AND LYMPHATIC: No history of anemia, easy bruising and bleeding.  SKIN: No rashes. No lesions.  MUSCULOSKELETAL: No arthralgias, myalgias.  NEUROLOGIC: No history of CVA or TIA. Significant for history of restless leg syndrome.  PSYCHIATRIC: No depression. No anxiety.   PHYSICAL EXAMINATION: VITAL SIGNS: Temperature is 99. Pulse  is 107. Respirations are 20. Blood pressure is 142/68, and pulse oximetry is 93% on room air.  GENERAL: Well-developed, well-nourished, ill-appearing 81 year old Caucasian female.   HEENT: Normocephalic, atraumatic. Pupils equal, reactive to light. Conjunctivae clear. Sclerae anicteric.  NECK: Supple. Trachea midline. No lymphadenopathy or thyromegaly.  PULMONARY: Symmetric rise and fall of chest. Clear to auscultation throughout.  CARDIOVASCULAR: Regular rhythm, S1, S2. No murmurs, no gallops.  ABDOMEN: Distended. Bowel sounds in 4 quadrants, high-pitched, more on Carrie right side of abdomen. Tenderness umbilically, left lower quadrant. No bruits. No masses.  RECTAL: Deferred. Evidence of feces, evidence of incontinence, yellowy-orange in appearance. No gross evidence of blood.  MUSCULOSKELETAL: Moving all 4 extremities. No contractures. No clubbing.  SKIN: No lesions. No rashes.  EXTREMITIES: No edema.  PSYCHIATRIC: Alert and oriented x 4. Memory grossly intact. Appropriate affect and mood.  NEUROLOGICAL: No gross neurological deficits.   LABORATORY AND DIAGNOSTIC DATA: Chemistry panel on admission: Glucose was 159. Sodium was 131. Potassium was 3.1. Chloride was low at 95. Calcium was 8.0. Trending today's date, potassium remains low at 3.1, and calcium has dropped to 7.4. Hepatic panel: Total protein is 6.0 with an albumin of 2.0. TSH is low at 0.307. WBC count on admission was 12.4, RBC off 3.55, hemoglobin 11.1 with hematocrit of 33.0. Hemoglobin has dropped to 9.1 with hematocrit of 27.0. MCV, MCH and MCHC have remained within normal limits. WBC count has normalized at 9.7 today. C. difficile toxin A and B is negative. Stool culture: Colonies too small to read but no pathologic evidence of E. coli or Campylobacter noted. Urinalysis revealed trace of ketones, +1 blood, +1 leukocytes. An occult feces is negative for blood.   CT of abdomen and pelvis with contrast was done yesterday. Findings: Marked inflammation of Carrie descending and sigmoid colon. Limited for excluding underlying mass at Carrie descending colon, sigmoid junction. Presently no ill-defined drainable abscess or  free intraperitoneal air noted. Proximal to Carrie colitis, fullness of Carrie remainder of Carrie colon and small bowel may represent reactive changes to colitis. Contracted gallbladder without calcified gallstones. Dilated common bile duct without pancreatic mass or calcified common bile duct noted. Splenic artery aneurysm noted at 1.7 cm. Bibasilar nodules, greater on Carrie right. A small hiatal hernia. Evidence of coronary artery disease and degenerative changes involving lumbar spine.   IMPRESSION: 1.  Colitis with associated abdominal pain secondary to Carrie consistent and ongoing findings of inflammation as noted on Carrie colonoscopy as well as on CT scan.  2.  Hypokalemia.  3.  Anemia.   PLAN: Carrie Carrie [name]'s presentation will be discussed with Dr. Lutricia FeilPaul Oh. At this time, in agreement with proceeding forward with  of antibiotic therapy. Carrie Carrie [name] is currently receiving ertapenem 500 mg IV piggyback every 12 hours. Additionally to continue Carrie Asacol 800 mg t.i.d. Proceed with potassium supplementation as ordered. She is currently receiving 20 mEq daily. At this time, do recommend close monitoring of Carrie Carrie [name]'s symptoms. Depending on how Carrie Carrie [name] responds to IV antibiotic therapy, Carrie Carrie [name] may benefit from surgical consultation.   These services provided by Carrie Keyawn S. Isys Tietje, MS, APRN, [facility], FNP, under collaborative agreement with Carrie StandingPaul Y. Carrie Kaufmannh, MD.   ____________________________ Carrie Keyawn S. Draken Farrior, NP dsh:jcm D: 11/21/2013 13:19:20 ET T: 11/21/2013 14:31:28 ET JOB#: [id number]  cc: Carrie Keyawn S. Khyla Mccumbers, NP, <Dictator> Carrie KeyAWN S Copeland Lapier MD ELECTRONICALLY SIGNED 11/21/2013 16:47

## 2014-11-02 NOTE — Consult Note (Signed)
Brief Consult Note: Diagnosis: Right lower quadrant abdominal pain.  AbnormalGI x-ray findings involving cecum and sigmoid area.  Diagnosed with acute diverticulitis.   Consult note dictated.   Discussed with Attending MD.   Comments: Patient's presentation discussed with Dr. Renae FicklePaul who has also spoken with hospitalist.  At time time recommendation is to advance her diet to clear liquid diet and monitor how she tolerates.  She is continued to improve and currently not experiencing any abdmominal pain.  Continue with antibiotic therapy.  If patient's prognosis continues to improve the goal is for discharge tomorrow and for her to follow-up on outpatient basis to discuss proceeding with diagnostic colonoscopy based on CT scan findings.  Patient in agreement to proceed in the manner.  Electronic Signatures: Rodman KeyHarrison, Dawn S (NP)  (Signed 15-Apr-15 13:53)  Authored: Brief Consult Note   Last Updated: 15-Apr-15 13:53 by Rodman KeyHarrison, Dawn S (NP)

## 2014-11-02 NOTE — Consult Note (Signed)
Chief Complaint:  Subjective/Chief Complaint Feeling well. No abd pain. Tolerated solids.   VITAL SIGNS/ANCILLARY NOTES: **Vital Signs.:   16-Apr-15 05:19  Vital Signs Type Routine  Temperature Temperature (F) 97.9  Celsius 36.6  Temperature Source oral  Pulse Pulse 91  Respirations Respirations 18  Systolic BP Systolic BP 136  Diastolic BP (mmHg) Diastolic BP (mmHg) 75  Mean BP 95  Pulse Ox % Pulse Ox % 98  Pulse Ox Activity Level  At rest  Oxygen Delivery Room Air/ 21 %   Brief Assessment:  GEN no acute distress   Cardiac Regular   Respiratory clear BS   Gastrointestinal Normal   Lab Results: Routine Chem:  15-Apr-15 05:13   Glucose, Serum 96  BUN 14  Creatinine (comp)  0.57  Sodium, Serum 136  Potassium, Serum 3.6  Chloride, Serum 101  CO2, Serum 29  Calcium (Total), Serum  8.1  Anion Gap  6  Osmolality (calc) 272  eGFR (African American) >60  eGFR (Non-African American) >60 (eGFR values <60mL/min/1.73 m2 may be an indication of chronic kidney disease (CKD). Calculated eGFR is useful in patients with stable renal function. The eGFR calculation will not be reliable in acutely ill patients when serum creatinine is changing rapidly. It is not useful in  patients on dialysis. The eGFR calculation may not be applicable to patients at the low and high extremes of body sizes, pregnant women, and vegetarians.)  Magnesium, Serum 1.9 (1.8-2.4 THERAPEUTIC RANGE: 4-7 mg/dL TOXIC: > 10 mg/dL  -----------------------)  Routine Hem:  15-Apr-15 05:13   WBC (CBC) 10.4  RBC (CBC)  3.49  Hemoglobin (CBC)  11.1  Hematocrit (CBC)  32.8  Platelet Count (CBC) 217  MCV 94  MCH 31.9  MCHC 33.8  RDW 13.3  Neutrophil % 74.3  Lymphocyte % 15.3  Monocyte % 9.5  Eosinophil % 0.7  Basophil % 0.2  Neutrophil #  7.7  Lymphocyte # 1.6  Monocyte #  1.0  Eosinophil # 0.1  Basophil # 0.0 (Result(s) reported on 24 Oct 2013 at 05:59AM.)   Assessment/Plan:   Assessment/Plan:  Assessment Diverticulitis. Stable   Plan ok with discharge today. Low residue diet. Oral Abx on discharge. F/U in office in 1-2 wks. Will set up outpt colonoscopy at the time of office visit. thanks   Electronic Signatures: Oh, Paul (MD)  (Signed 16-Apr-15 13:38)  Authored: Chief Complaint, VITAL SIGNS/ANCILLARY NOTES, Brief Assessment, Lab Results, Assessment/Plan   Last Updated: 16-Apr-15 13:38 by Oh, Paul (MD) 

## 2014-11-02 NOTE — Consult Note (Signed)
Chief Complaint:  Subjective/Chief Complaint No specific complaints. Hard of hearing. Flat affect. Reviewed CT results with patient and family.   VITAL SIGNS/ANCILLARY NOTES: **Vital Signs.:   12-Jun-15 07:55  Vital Signs Type Q 8hr  Temperature Temperature (F) 98.7  Celsius 37  Temperature Source oral  Pulse Pulse 101  Respirations Respirations 18  Systolic BP Systolic BP 767  Diastolic BP (mmHg) Diastolic BP (mmHg) 76  Mean BP 99  Pulse Ox % Pulse Ox % 96  Pulse Ox Activity Level  At rest  Oxygen Delivery Room Air/ 21 %   Brief Assessment:  GEN no acute distress   Cardiac Regular   Respiratory clear BS   Gastrointestinal Tender in LLQ area   Lab Results: Thyroid:  12-Jun-15 04:30   Thyroxine, Free 1.35 (Result(s) reported on 21 Dec 2013 at 12:59PM.)  Thyroid Stimulating Hormone  0.247 (0.45-4.50 (International Unit)  ----------------------- Pregnant patients have  different reference  ranges for TSH:  - - - - - - - - - -  Pregnant, first trimetser:  0.36 - 2.50 uIU/mL)  Routine Chem:  12-Jun-15 04:30   Glucose, Serum  177  BUN  23  Creatinine (comp) 0.61  Sodium, Serum  131  Potassium, Serum 3.7  Chloride, Serum  97  CO2, Serum 26  Calcium (Total), Serum  7.8  Anion Gap 8  Osmolality (calc) 271  eGFR (African American) >60  eGFR (Non-African American) >60 (eGFR values <92mL/min/1.73 m2 may be an indication of chronic kidney disease (CKD). Calculated eGFR is useful in patients with stable renal function. The eGFR calculation will not be reliable in acutely ill patients when serum creatinine is changing rapidly. It is not useful in  patients on dialysis. The eGFR calculation may not be applicable to patients at the low and high extremes of body sizes, pregnant women, and vegetarians.)  Magnesium, Serum 1.9 (1.8-2.4 THERAPEUTIC RANGE: 4-7 mg/dL TOXIC: > 10 mg/dL  -----------------------)  Routine Hem:  12-Jun-15 04:30   WBC (CBC)  15.3  RBC (CBC)   2.86  Hemoglobin (CBC)  8.6  Hematocrit (CBC)  26.2  Platelet Count (CBC) 403  MCV 92  MCH 30.2  MCHC 32.9  RDW  15.1  Neutrophil % 93.0  Lymphocyte % 4.2  Monocyte % 2.7  Eosinophil % 0.0  Basophil % 0.1  Neutrophil #  14.2  Lymphocyte #  0.6  Monocyte # 0.4  Eosinophil # 0.0  Basophil # 0.0 (Result(s) reported on 21 Dec 2013 at 05:16AM.)   Radiology Results: CT:    11-Jun-15 21:02, CT Abdomen and Pelvis With Contrast  CT Abdomen and Pelvis With Contrast   REASON FOR EXAM:    (1) pain, uptrending wbc, recent colitis; (2) LLQ   pain, recent colitis.  COMMENTS:       PROCEDURE: CT  - CT ABDOMEN / PELVIS  W  - Dec 20 2013  9:02PM     CLINICAL DATA:  Left lower quadrant pain    EXAM:  CT ABDOMEN AND PELVISWITH CONTRAST    TECHNIQUE:  Multidetector CT imaging of the abdomen and pelvis was performed  using the standard protocol following bolus administration of  intravenous contrast.  CONTRAST:  80 mL Isovue 300    COMPARISON:  11/20/2013    FINDINGS:  Lung bases are well aerated. Small basilar nodules are again  identified and stable from the prior exam. Follow-up recommendations  are similar to that described on the previous study.    The liver, gallbladder,  spleen, adrenal glands and pancreas are  within normal limits. The kidneys are well visualized bilaterally  without renal calculi or obstructive change. Delayed images  demonstrate normal excretion of contrast material.    Aortic calcifications are seen without aneurysmal dilatation. The  appendix is not visualized. No inflammatory changes are seen.    There remains thickening of the descending colon and sigmoid colon  when compared with the prior exam. There is however a focus of  extraluminal fluid adjacent to the sigmoid best seen on image number  68 of series 2. This measures approximately 3.1 x 1.7 cm. It  measures 5.6 cm in greatest craniocaudad projection. These changes  are consistent with an  evolving abscess. The bladder is  decompressed. A small amount of air is noted within the bladder  similar to that seen on the prior exam. The bony structures are  stable in appearance.     IMPRESSION:  Evolving abscess adjacent to the sigmoid colon as described above.  The remainder of the exam is stable from the prior study.      Electronically Signed    By: Inez Catalina M.D.    On: 12/20/2013 21:22         Verified By: Everlene Farrier, M.D.,   Assessment/Plan:  Assessment/Plan:  Assessment Hx of diverticulitis and colitis. Now with evolving abscess.   Plan Discussed case with Dr. Felton Clinton. At this point, surgical resection may be necessary to treat abscess but also help [place] with pt's true diagnosis. Relayed that info with family. Will follow. Thanks.   Electronic Signatures: Verdie Shire (MD)  (Signed 12-Jun-15 14:12)  Authored: Chief Complaint, VITAL SIGNS/ANCILLARY NOTES, Brief Assessment, Lab Results, Radiology Results, Assessment/Plan   Last Updated: 12-Jun-15 14:12 by Verdie Shire (MD)

## 2014-11-02 NOTE — Consult Note (Signed)
Brief Consult Note: Diagnosis: Acute colitis with failure to respond to outpatient antibiotic therapy.  Hypokalemia.   Consult note dictated.   Discussed with Attending MD.   Comments: Patient's presentation discussed with Dr. Lutricia FeilPaul Oh.  Recommend continuation of antibiotic therapy, Meropenem 500 mg IV every 12 hours.  Continue with clear liquid diet.  Patient will be seen and evaluated by Dr. Lutricia FeilPaul Oh prior to advancing medication therapy at this time.  Possible consideration of adding steroid therapy.  Will continue to follow at this time.   Documentation as noted in brief consult not to also represent addendum of care plan with H & P already dictated on this patient.  Electronic Signatures: Rodman KeyHarrison, Dawn S (NP)  (Signed 13-May-15 15:25)  Authored: Brief Consult Note   Last Updated: 13-May-15 15:25 by Rodman KeyHarrison, Dawn S (NP)

## 2014-11-02 NOTE — H&P (Signed)
PATIENT NAME:  Carrie [name], Carrie [name] MR#:  [id number] DATE OF BIRTH:  1930-02-08  DATE OF ADMISSION:  10/23/2013  PRIMARY CARE PHYSICIAN:  Dr. Larwance Sachs.  REFERRING PHYSICIAN:  Dr. Shaune Pollack.  CHIEF COMPLAINT:  Abdominal pain, 3 days.   HISTORY OF PRESENT ILLNESS: An 81 year old Caucasian female with a history of diverticulitis, arthritis, presented to the ED with abdominal pain of 3 days. Abdominal pain is on the right lower quadrant, intermittent, aching without any radiation. The patient is about 5 out of 10. The patient denies any nausea, vomiting, diarrhea, but has a little rectal bleeding today. The patient denies any fever or chills. Denies any other symptoms. CAT scan of abdomen and pelvis showed diverticulitis. Need to rule out a tumor.     PAST MEDICAL HISTORY: Diverticulitis, arthritis.     SOCIAL HISTORY: No smoking or drinking or illicit drugs.   FAMILY HISTORY: Hypertension, diabetes, cancer in her family.   ALLERGIES: ZITHROMAX, NITROFURANTOIN.   HOME MEDICATIONS: Percocet 325 mg/5 mg p.o. twice a day, Aleve 220 mg 2 tablets b.i.d. p.r.n., aspirin 81 mg p.o. daily, calcium 600 plus 600 mg/200  units, 1 tablet b.i.d.; Centrum Silver therapeutic multivitamin 1 tablet p.o. daily, cetirizine 10 mg p.o. once a day p.r.n. for allergies, cranberry p.o. capsule 6 caps once a day, Evista 60 mg p.o. daily, meloxicam 15 mg p.o. daily, pramipexole 0.25 mg p.o. at bedtime, Robaxin 500 mg p.o. 4 times a day p.r.n. for muscle spasm, vitamin C 500 mg p.o. daily, vitamin E 400 International Units 2 caps once a day.   REVIEW OF SYSTEMS:  CONSTITUTIONAL: The patient denies any fever or chills. No headache or dizziness.  EYES: No double vision or blurry vision.  ENT: No postnasal drip, slurred speech or dysphagia.  CARDIOVASCULAR: No chest pain, palpitations, orthopnea or nocturnal dyspnea. No leg edema.  PULMONARY: No cough, sputum, shortness of breath or hemoptysis.  GASTROINTESTINAL: Positive for abdominal  pain, but no nausea, vomiting, diarrhea. No melena, but has bloody stool.  GENITOURINARY: No dysuria, hematuria or incontinence.  SKIN: No rash or jaundice.  NEUROLOGY: No syncope, loss of consciousness or seizure.  ENDOCRINE: No polyuria, polydipsia, heat or cold intolerance.  HEMATOLOGY:  Has rectal bleeding. No easy bruising.   PHYSICAL EXAMINATION: VITAL SIGNS: Temperature 97.7, blood pressure 157/79, pulse 97, respirations 18, O2 saturation 97% on room air.  GENERAL: The patient is alert, awake, oriented, in no acute distress.  HEENT: Pupils round, equal, reactive to light and accommodation.  NECK: Supple. No JVD or carotid bruit. No lymphadenopathy. No thyromegaly.  CARDIOVASCULAR: S1, S2, regular rate, rhythm. No murmurs or gallop.  PULMONARY: Bilateral air entry. No wheezing or rales. No use of accessory muscle to breathe.  ABDOMEN: Soft and bowel sounds present. Tenderness on the right lower quadrant. No rigidity. No rebound. No organomegaly.  EXTREMITIES: No edema, clubbing or cyanosis. No calf tenderness. Bilateral pedal pulses present.   LABORATORY DATA: Lactic acid 1.3. Urinalysis negative. CAT scan of the abdomen and pelvis showed focal abnormal wall thickening of the sigmoid colon; could be from tumor or localized inflammation, less likely to be due to underlying diverticulosis.  Splenic artery aneurysm 1.7 cm.   WBC 13.7, hemoglobin 12.1, platelets 237. Lipase 140. Glucose 126, BUN 23, creatinine 0.63, sodium 130, potassium 3.7, chloride 94, bicarb 31.   IMPRESSIONS: 1.  Diverticulitis, need to rule out sigmoid tumor. 2.  Leukocytosis.  3.  Gastrointestinal bleeding.  4.  Hyponatremia.  5.  Dehydration.  6.  Hypertension.   PLAN OF TREATMENT: 1.  The patient will be admitted to medical floor. We will start Cipro, Flagyl, start Protonix. We will  get a GI consult from Dr. Bluford Kaufmannh.  2.  Keep n.p.o. Give normal saline IV. Follow up BMP, CBC.  3.  Will give Norvasc for  hypertension.  4.  Discussed the patient's condition and plan of treatment with the patient; also discussed with the patient's family member.   The patient wants FULL CODE.   TIME SPENT: About 55 minutes.    ____________________________ Shaune PollackQing Baraa Tubbs, MD qc:dmm D: 10/23/2013 20:39:29 ET T: 10/23/2013 21:05:31 ET JOB#: [id number]  cc: Shaune PollackQing Glennie Rodda, MD, <Dictator> Shaune PollackQING Strother Everitt MD ELECTRONICALLY SIGNED 10/25/2013 18:07

## 2014-11-02 NOTE — Consult Note (Signed)
PATIENT NAME:  Carrie [name], Carrie [name] MR#:  [id number] DATE OF BIRTH:  01-30-1930  DATE OF ADMISSION: 12/20/2013   DATE OF CONSULTATION:  12/20/2013  CONSULTING PHYSICIAN:  Ezzard StandingPaul Y. Esau Fridman, MD  REASON FOR ADMISSION: Persistent colitis with abdominal pain.   DESCRIPTION: The patient is an 81 year old [place] female who was hospitalized a few times recently. The first time, she was admitted in April with a diagnosis of acute diverticulitis and treated with IV antibiotics. She then had an outpatient colonoscopy. This showed rather severe colitis, with biopsies confirming active colitis. Unfortunately, due to the significant colonic edema, I could only advance the scope to the sigmoid colon. She was again treated with antibiotics and then Asacol. As the pain persisted and symptoms got worse, she ended up being admitted in May. CT scan done showed a progressively worsening colitis with the whole left side of the colon being significantly inflamed. Therefore, the patient was again given intravenous antibiotics and placed on IV steroids. The patient slowly improved. The patient was  supposed to be discharged on 60 of prednisone with gradual taper. For whatever reason, the patient was not on this high dose of prednisone. The patient only took 10 mg of prednisone daily for the past 3 weeks. As a result, the patient has not gotten any better. In fact, she continues to have multiple loose stools, up to 6 loose stools per day, with some blood when she wipes herself. She has had increasing weakness, some fevers and chills as well as anorexia and 10-pound weight loss. She was seen by me in the office and then elected to be admitted to the hospital.   According to the family, the patient has been rather almost bedridden, not very active. She is getting more depressed. It has been more and more difficult for the husband as well as the son and daughter to take care.   PAST MEDICAL HISTORY: History of diverticulitis and colitis. She  also has a history of osteoporosis, arthritis and restless leg syndrome.   ALLERGIES: SHE IS ALLERGIC TO AZITHROMYCIN AND NITROFURANTOIN.   SOCIAL HISTORY: She denies tobacco and alcohol use.   FAMILY HISTORY: Notable for diabetes, hypertension and cancer.   MEDICATION AT HOME: Is only 10 mg of prednisone daily, hydrocodone and Tylenol twice a day, calcium, multivitamins, baby aspirin, Evista 60 mg daily, meloxicam 50 mg daily, eyedrops.   REVIEW OF SYMPTOMS: She has had at least 10-pound weight loss with decreased appetite and increased fatigue and abdominal pain. There is no visual or hearing changes. No cough. No shortness of breath. There is no chest pain or palpitations, but she does complain of nausea, decreased appetite and increasing abdominal pain. The rest of the review of symptoms is negative.   PHYSICAL EXAMINATION:  GENERAL: The patient is an elderly female with a very flat affect, who appears depressed.  VITAL SIGNS: She is afebrile. Vital signs are stable.  HEAD AND NECK: Within normal limits.  CARDIAC: Revealed regular rhythm and rate.  LUNGS: Clear bilaterally.  ABDOMEN: Showed normoactive bowel sounds. It was soft. There was some tenderness in the left lower quadrant area to deep palpation. There is no rebound or guarding.  EXTREMITIES: Show no clubbing, cyanosis or edema.  NEUROLOGIC: Nonfocal.  SKIN: Negative.   LABORATORY DATA: She had a CBC done recently by her primary doctor. Her hemoglobin was in the upper 9 range, Jaggers count was normal. A complete metabolic panel was ordered in the office. They are pending at this time.  IMPRESSION: This is a patient with persistent colitis with subsequent abdominal pain, diarrhea, some bleeding and significant weight loss. The patient is being admitted. We will start the patient on IV steroids. We may give her some intravenous antibiotics as well. While she is in the hospital, we will try to schedule a colonoscopy for her to  evaluate the whole left side of the colon as well as the right side if at all possible. In terms of disposition, she may need to go to skilled nursing facility after she is hospitalized here for further care and rehab.   Thank you for the referral.   ____________________________ Ezzard Standing. Bluford Kaufmann, MD pyo:lb D: 12/21/2013 08:04:00 ET T: 12/21/2013 08:49:53 ET JOB#: [id number]  cc: Ezzard Standing. Bluford Kaufmann, MD, <Dictator> Ezzard Standing Solomon Skowronek MD ELECTRONICALLY SIGNED 12/21/2013 11:46

## 2014-11-02 NOTE — Consult Note (Signed)
Chief Complaint:  Subjective/Chief Complaint Foul mood due to persistent urination prob from IVF. Abd less painful.   VITAL SIGNS/ANCILLARY NOTES: **Vital Signs.:   16-May-15 05:20  Vital Signs Type Routine  Temperature Temperature (F) 98  Celsius 36.6  Temperature Source oral  Pulse Pulse 97  Respirations Respirations 20  Systolic BP Systolic BP 643  Diastolic BP (mmHg) Diastolic BP (mmHg) 84  Mean BP 106  Pulse Ox % Pulse Ox % 92  Pulse Ox Activity Level  At rest  Oxygen Delivery Room Air/ 21 %   Brief Assessment:  GEN no acute distress   Cardiac Regular   Respiratory clear BS   Gastrointestinal mild llq tenderness   Lab Results: LabUnknown:  15-May-15 05:44   Result Interpretation Peripheral blood smear reveals a left shift in myeloid maturity, consistent with known colitis and treatment with steroids. T.Rubinas MD.  Result(s) reported on 23 Nov 2013 at 07:01AM.  Routine Chem:  15-May-15 05:44   Glucose, Serum  147  BUN  6  Creatinine (comp)  0.52  Sodium, Serum 136  Potassium, Serum 4.8  Chloride, Serum 104  CO2, Serum 26  Calcium (Total), Serum  7.7  Anion Gap  6  Osmolality (calc) 272  eGFR (African American) >60  eGFR (Non-African American) >60 (eGFR values <105mL/min/1.73 m2 may be an indication of chronic kidney disease (CKD). Calculated eGFR is useful in patients with stable renal function. The eGFR calculation will not be reliable in acutely ill patients when serum creatinine is changing rapidly. It is not useful in  patients on dialysis. The eGFR calculation may not be applicable to patients at the low and high extremes of body sizes, pregnant women, and vegetarians.)  Result Comment cbc - PATHOLOGIST TO REVIEW SMEAR. COMMENTS  - APPEAR ON REPORT WHEN COMPLETE.  Result(s) reported on 23 Nov 2013 at 07:01AM.  Routine Hem:  15-May-15 05:44   WBC (CBC) 4.9  RBC (CBC)  3.37  Hemoglobin (CBC)  10.4  Hematocrit (CBC)  31.5  Platelet Count (CBC)  337  MCV 93  MCH 30.7  MCHC 32.9  RDW 14.2  Bands 16  Segmented Neutrophils 60  Lymphocytes 8  Monocytes 1  Eosinophil 1  Metamyelocyte 6  Myelocyte 7  Promyelocyte 1  Diff Comment 2 RBCs APPEAR NORMAL  Diff Comment 3 NORMAL PLT MORPHOLGY   Assessment/Plan:  Assessment/Plan:  Assessment Left sided colitis. Slowly improving.   Plan D/C IV solumedrol. Can switch to oral Abx. Prrednisone $RemoveBeforeDE'40mg'spYmQVohfdJWPgM$  daiy. Taper by $Remove'10mg'xbWRqzL$  weekly. Can hold off mesalamine while on prednisone.thanks   Electronic Signatures: Verdie Shire (MD)  (Signed 16-May-15 11:21)  Authored: Chief Complaint, VITAL SIGNS/ANCILLARY NOTES, Brief Assessment, Lab Results, Assessment/Plan   Last Updated: 16-May-15 11:21 by Verdie Shire (MD)

## 2014-11-02 NOTE — Consult Note (Signed)
Brief Consult Note: Diagnosis: perforated sigmoid colon, chronic colitis, unclear etiology, medical management failure.   Patient was seen by consultant.   Consult note dictated.   Recommend further assessment or treatment.   Discussed with Attending MD.   Comments: I discussed hartmann's procedure for alleviation of fluid collection, and diversion and to establish a definitive diagnosis.  Family is to discuss and I will return in am.  Discussed in detail with Dr Bluford Kaufmannh of GI medicine.  Electronic Signatures: Natale LayBird, Naylee Frankowski (MD)  (Signed 12-Jun-15 16:38)  Authored: Brief Consult Note   Last Updated: 12-Jun-15 16:38 by Natale LayBird, Denetra Formoso (MD)

## 2014-11-02 NOTE — Consult Note (Signed)
Pt seen and examined. Pt admitted with RLQ abd pain but CT showed sigmoid diverticulitis. Had sigmoid diverticulosis in 2010. Today, no abd pain. Abd nontender. Agree with Abx. Agree with starting clears and advancing diet. If patient tolerates solids by tomorrow, ok for discharge with f/u in office in few weeks. Repeat colon in about a month. Thanks.  Electronic Signatures: Lutricia Feilh, Ison Wichmann (MD)  (Signed on 15-Apr-15 12:58)  Authored  Last Updated: 15-Apr-15 12:58 by Lutricia Feilh, Kierston Plasencia (MD)

## 2014-11-02 NOTE — Consult Note (Signed)
Chief Complaint:  Subjective/Chief Complaint No signif change from yest. Still with left sided abd pain. Having BM's. No bleeding.   VITAL SIGNS/ANCILLARY NOTES: **Vital Signs.:   14-May-15 04:29  Vital Signs Type Routine  Temperature Temperature (F) 99.2  Celsius 37.3  Temperature Source oral  Pulse Pulse 106  Respirations Respirations 20  Systolic BP Systolic BP 615  Diastolic BP (mmHg) Diastolic BP (mmHg) 74  Mean BP 93  Pulse Ox % Pulse Ox % 91  Pulse Ox Activity Level  At rest  Oxygen Delivery Room Air/ 21 %   Brief Assessment:  GEN no acute distress   Cardiac Regular   Respiratory clear BS   Gastrointestinal left sided tenderness. decreased bwowel sounds   Lab Results: Thyroid:  13-May-15 04:12   Thyroid Stimulating Hormone  0.307 (0.45-4.50 (International Unit)  ----------------------- Pregnant patients have  different reference  ranges for TSH:  - - - - - - - - - -  Pregnant, first trimetser:  0.36 - 2.50 uIU/mL)  Routine Chem:  13-May-15 04:12   Glucose, Serum 81  BUN 11  Creatinine (comp) 0.64  Sodium, Serum  135  Potassium, Serum  3.1  Chloride, Serum 101  CO2, Serum 27  Calcium (Total), Serum  7.4  Anion Gap 7  Osmolality (calc) 269  eGFR (African American) >60  eGFR (Non-African American) >60 (eGFR values <52mL/min/1.73 m2 may be an indication of chronic kidney disease (CKD). Calculated eGFR is useful in patients with stable renal function. The eGFR calculation will not be reliable in acutely ill patients when serum creatinine is changing rapidly. It is not useful in  patients on dialysis. The eGFR calculation may not be applicable to patients at the low and high extremes of body sizes, pregnant women, and vegetarians.)  Routine Hem:  13-May-15 04:12   WBC (CBC) 9.7  RBC (CBC)  2.91  Hemoglobin (CBC)  9.1  Hematocrit (CBC)  27.0  Platelet Count (CBC) 301  MCV 93  MCH 31.3  MCHC 33.7  RDW 14.1  Neutrophil % 84.9  Lymphocyte % 9.2   Monocyte % 5.5  Eosinophil % 0.3  Basophil % 0.1  Neutrophil #  8.3  Lymphocyte #  0.9  Monocyte # 0.5  Eosinophil # 0.0  Basophil # 0.0 (Result(s) reported on 21 Nov 2013 at 05:34AM.)   Assessment/Plan:  Assessment/Plan:  Assessment Left sided colitis. Stool neg. On Abx and mesalamine.   Plan Add low dose IV steroids and see if symptoms improve.   Electronic Signatures: Verdie Shire (MD)  (Signed 14-May-15 13:06)  Authored: Chief Complaint, VITAL SIGNS/ANCILLARY NOTES, Brief Assessment, Lab Results, Assessment/Plan   Last Updated: 14-May-15 13:06 by Verdie Shire (MD)

## 2014-11-02 NOTE — Consult Note (Signed)
Chief Complaint:  Subjective/Chief Complaint Overall better. Less abd pain. Having BM's. On IV Abx. On po prednisone.   VITAL SIGNS/ANCILLARY NOTES: **Vital Signs.:   17-May-15 08:21  Vital Signs Type tele  Pulse Pulse 104  Pulse source if not from Vital Sign Device per Telemetry Clerk  Telemetry pattern Cardiac Rhythm Sinus tachycardia; pattern reported by Telemetry Clerk   Brief Assessment:  GEN no acute distress   Cardiac Regular   Respiratory clear BS   Gastrointestinal mild LLQ abd tenderness   Lab Results: LabUnknown:  15-May-15 05:44   Result Interpretation Peripheral blood smear reveals a left shift in myeloid maturity, consistent with known colitis and treatment with steroids. T.Rubinas MD.  Result(s) reported on 23 Nov 2013 at 07:01AM.  Routine Chem:  15-May-15 05:44   Glucose, Serum  147  BUN  6  Creatinine (comp)  0.52  Sodium, Serum 136  Potassium, Serum 4.8  Chloride, Serum 104  CO2, Serum 26  Calcium (Total), Serum  7.7  Anion Gap  6  Osmolality (calc) 272  eGFR (African American) >60  eGFR (Non-African American) >60 (eGFR values <82mL/min/1.73 m2 may be an indication of chronic kidney disease (CKD). Calculated eGFR is useful in patients with stable renal function. The eGFR calculation will not be reliable in acutely ill patients when serum creatinine is changing rapidly. It is not useful in  patients on dialysis. The eGFR calculation may not be applicable to patients at the low and high extremes of body sizes, pregnant women, and vegetarians.)  Result Comment cbc - PATHOLOGIST TO REVIEW SMEAR. COMMENTS  - APPEAR ON REPORT WHEN COMPLETE.  Result(s) reported on 23 Nov 2013 at 07:01AM.  Routine Hem:  15-May-15 05:44   WBC (CBC) 4.9  RBC (CBC)  3.37  Hemoglobin (CBC)  10.4  Hematocrit (CBC)  31.5  Platelet Count (CBC) 337  MCV 93  MCH 30.7  MCHC 32.9  RDW 14.2  Bands 16  Segmented Neutrophils 60  Lymphocytes 8  Monocytes 1  Eosinophil 1   Metamyelocyte 6  Myelocyte 7  Promyelocyte 1  Diff Comment 2 RBCs APPEAR NORMAL  Diff Comment 3 NORMAL PLT MORPHOLGY   Assessment/Plan:  Assessment/Plan:  Assessment Colitis. Gradually improving. Pneumonia on Abx.   Plan Continue Abx on discharge. Continue po prednisone with gradual taper. See yesterday's notes. Will sign off. Have patient f/u with Ebony Cargo in GI office. thanks   Electronic Signatures: Verdie Shire (MD)  (Signed 17-May-15 11:16)  Authored: Chief Complaint, VITAL SIGNS/ANCILLARY NOTES, Brief Assessment, Lab Results, Assessment/Plan   Last Updated: 17-May-15 11:16 by Verdie Shire (MD)

## 2014-11-02 NOTE — Discharge Summary (Signed)
PATIENT NAME:  Carrie [name], Carrie [name] MR#:  [id number] DATE OF BIRTH:  January 16, 1930  DATE OF ADMISSION:  12/20/2013 DATE OF DISCHARGE:  12/22/2013   ADMITTING PHYSICIAN: Krystal EatonShayiq Ahmadzia, MD   DISCHARGING PHYSICIAN: Sona A. Allena KatzPatel, MD   The patient is being transferred to [facility] in [place].   DISCHARGE DIAGNOSES: 1.  Worsening sigmoid diverticular abscess.  2.  History of recent acute colitis with descending and sigmoid colitis.  3.  Osteoporosis.  4.  Osteoarthritis.  5.  Restless leg syndrome.  6.  Depression with flat affect.   DISCHARGE MEDICATIONS:    1.  Protonix 40 mg p.o. q.a.m.  2.  Evista  60 mg p.o. daily.  3.  Mirapex 0.25 mg at bedtime.  4.  Zofran 4 mg IV q.4 hours p.r.n. for nausea, vomiting.  5.  Ciprofloxacin 400 mg IV q.12 hours.  6.  Flagyl 500 mg IV q.8 hours.  7.  Solu-Medrol 20 mg IV q.8 hours.  8.  Zyrtec 10 mg p.o. daily.  9.  Norco 5/325 mg 1 tablet q.4 to 6 hours p.r.n.   DIET AT THE TIME OF TRANSFER: Clear liquid with n.p.o. after midnight for possible surgery.   LABORATORY AND IMAGING STUDIES: On December 21, 2013, WBCs 15.3, hemoglobin 8.6, hematocrit 26.2, platelet count 403.   Sodium 131, potassium 3.7, chloride 97, bicarbonate 26, BUN 23, creatinine 0.61, glucose 177, and calcium of 7.8. Magnesium is 1.9. TSH is 0.247. Free thyroxine is 1.35.   Stool cultures are negative. Blood cultures have remained negative. Stool for C. difficile is negative.   CT of the abdomen and pelvis with contrast from December 20, 2013, showing clear lung bases. Small basilar nodules again identified and stable from prior exam. Liver, gallbladder, spleen and adrenal glands and pancreas are within normal limits. Kidneys are well visualized bilaterally. Evolving abscess adjacent to sigmoid colon noted. This measures about 3.1 x 1.7 cm and 5.6 cm in greatest craniocaudal projection. Bladder is decompressed. Thickening of descending and sigmoid colon noted on previous exam is  still present and stable. No inflammatory changes seen around appendicular region.   BRIEF HOSPITAL COURSE: Carrie [name] is an 81 year old elderly female with past medical history significant for diverticulosis, osteoarthritis, osteoporosis, who has had recent couple of hospitalizations in May 2015 and also April 2015 for recurrent diverticulitis. The patient was on Cipro and Flagyl, discharged home. However, brought back to the hospital secondary to abdominal pain, worsening nausea and vomiting.  1.  Recurrent diverticulitis with developing sigmoid diverticular abscess: The patient failed outpatient therapy; however, she was developing an abscess in the sigmoid region. She was seen by consultations from GI and also surgery, Dr. Egbert GaribaldiBird, here in the hospital. They have decided to do Hartmann's procedure and diversion, and the patient is scheduled for possible surgery for December 23, 2013. Plan was to do colectomy or colostomy. The patient was also empirically on Cipro and Flagyl. However, the patient's family discussed about transferring patient to [facility] in [place], and Dr. Tedd SiasAbernathy has accepted the patient and the patient will be transferred over to [facility] today. She is also on steroids for decreasing her inflammation at this time.  2.  Osteoporosis: The patient is on Evista.  3.  Restless leg syndrome: The patient is on Mirapex.   Her course has been otherwise uneventful here in the hospital.   DISCHARGE CONDITION: Stable.   DISCHARGE DISPOSITION: To [facility] in [place] for further treatment.  TIME SPENT ON DISCHARGE: 40 minutes.   ____________________________ Enid Baas, MD rk:jcm D: 12/22/2013 17:39:03 ET T: 12/22/2013 18:05:08 ET JOB#: [id number]  cc: Enid Baas, MD, <Dictator> Enid Baas MD ELECTRONICALLY SIGNED 12/23/2013 14:31

## 2014-11-02 NOTE — Op Note (Signed)
PATIENT NAME:  Carrie [name], Carrie [name] MR#:  [id number] DATE OF BIRTH:  10-10-1929  DATE OF PROCEDURE:  12/21/2013  PREOPERATIVE DIAGNOSIS: Acute calculus cholecystitis.   POSTOPERATIVE DIAGNOSIS: Acute calculus cholecystitis.   PROCEDURE PERFORMED: Laparoscopic cholecystectomy.   SURGEON: Natale LayMark Syd Manges, MD   ASSISTANT: None.   ANESTHESIA: General endotracheal.   FINDINGS: Acute cholecystitis.   SPECIMENS: Gallbladder with contents.   ESTIMATED BLOOD LOSS: 25 mL.   DRAINS: None.  LAP and needle count correct x 2.   DESCRIPTION OF PROCEDURE: With informed consent, supine position, general endotracheal anesthesia, the patient's abdomen was widely prepped and draped with ChloraPrep solution. Timeout was observed.   A 12-mm blunt Hasson trocar was placed in an open technique through an infraumbilical transverse <<MISSING TEXT>> skin incision with stay sutures being passed through the fascia. Pneumoperitoneum was established. The patient was then positioned in reverse Trendelenburg and airplane right side up. A 5-mm Bladeless trocar was placed in the epigastric region, two 5-mm first assistant ports the right subcostal margin. The gallbladder was aspirated of thin bile. It was grasped along its fundus and elevated towards the right shoulder and laterally. Omental adhesions were taken off the body of the gallbladder with blunt technique and point cautery and small bleeding vessels.   The hepatoduodenal ligament was then incised utilizing blunt technique and hook electrocautery. A single cystic duct and a single cystic artery were identified critically identified with a critical view of safety cholecystectomy. Three clips were placed on the portal side of each structure, 1 clip on the gallbladder side and the structures were then divided. The gallbladder was then retrieved off the gallbladder fossa utilizing hook cautery apparatus, placed into an Endo Catch device and retrieved. Pneumoperitoneum was then  re-established.   Hemostasis was obtained in the gallbladder fossa with point cautery. Five milliliters of Surgiflo with thrombin application was applied. Ports were then removed under direct visualization. The infraumbilical fascial defect being reapproximated with an additional figure-of-eight #0 Vicryl suture in vertical orientation. The existing stay sutures tied to each other. Skin edges were reapproximated utilizing 4-0 nylon simple and vertical mattress orientation. A total of 30 mL of 0.25% plain Marcaine was infiltrated prior to the conclusion of the operation, Telfa and Tegaderm were then applied and the patient was then subsequently extubated and taken to the recovery room in stable and satisfactory condition by anesthesia services.    ____________________________ Redge GainerMark A. Egbert GaribaldiBird, MD mab:lt D: 12/22/2013 11:19:37 ET T: 12/22/2013 22:11:55 ET JOB#: [id number]  cc: Loraine LericheMark A. Egbert GaribaldiBird, MD, <Dictator>

## 2014-11-02 NOTE — Discharge Summary (Signed)
PATIENT NAME:  Carrie [name], [name] MR#:  [id number] DATE OF BIRTH:  Jul 30, 1929  DATE OF ADMISSION:  10/23/2013 DATE OF DISCHARGE:  10/25/2013  For a detailed note, please see the history and physical done on admission by Dr. Imogene Burnhen.   DIAGNOSES AT DISCHARGE: Is as follows:  1. Acute diverticulitis.  2. Restless leg syndrome.  3. Osteoporosis.  4. Osteoarthritis.  5. The patient is being discharged on a low residue diet.   ACTIVITY: As tolerated.   FOLLOWUP:  Dr. Renae FicklePaul Oh from gastroenterology in the next 1-2 weeks, also follow up with Dr. Kandyce RudMarcus Babaoff in the next 1-2 weeks.   DISCHARGE MEDICATIONS: Pramipexole 0.25 mg at bedtime, meloxicam 15 mg daily, calcium vitamin D 1 tab b.i.d., Centrum multivitamin daily, cetirizine 10 mg daily as needed, Evista 60 mg daily, aspirin 81 mg daily, vitamin E 400 international units 2 caps daily, vitamin C 5 mg daily, Robaxin 5 mg q.i.d. as needed, Tylenol with hydrocodone 5/325 one tab b.i.d., Aleve 220 mg 2 tabs b.i.d. as needed, cranberry supplements daily, ciprofloxacin 5 mg b.i.d. x 7 days, and metronidazole 5 mg q. 8 hours x 7 days.   CONSULTANTS DURING THE HOSPITAL COURSE: Dr. Lutricia FeilPaul Oh from gastroenterology.   PERTINENT STUDIES DONE DURING THE HOSPITAL COURSE: A CT scan of the abdomen and pelvis done with contrast on 04/14 showing focal abnormal wall thickening in the sigmoid colon could be a tumor or localized inflammation less likely to be due to underlying diverticulosis. Resume presumptive treatment for diverticulitis and subsequent sigmoidoscopy to rule out tumor, distended right colon, splenic artery aneurysm 1.7 cm in diameter, bibasilar cylindrical bronchiectasis.  HOSPITAL COURSE: This is an 81 year old female with medical problems as mentioned above, presented to the hospital with abdominal pain, nausea and vomiting, and CT scan findings suggestive of diverticulitis.  1. Acute diverticulitis. This was likely the cause of the patient's abdominal  pain, nausea and vomiting. The patient was admitted to the hospital, started on IV Cipro, Flagyl, and supportive care with pain control, IV fluids, and antiemetics. After getting 2 days of IV antibiotics, the patient's clinical symptoms have significantly improved. Her diet has been slowly advanced from a clear liquid to a regular diet, which she is also tolerating well. There was also some concern for underlying tumor on her CT scan. Although Dr. Bluford Kaufmannh did not feel that the patient needed urgent colonoscopy or sigmoidoscopy, but this is to be arranged as an outpatient once her acute inflammatory process resolves. He is going to schedule that after he sees the patient on followup upon discharge. Since patient is clinically doing well and tolerating p.o. well with no fever with a normal Rami cell count. She is being discharged on oral antibiotics for the next 7 days.  2. Osteoporosis. The patient was maintained on her Evista. She will resume that.  3. Restless leg syndrome. The patient was maintained on her Requip. She will resume that.  4. Osteoarthritis. The patient was maintained on her meloxicam and she will also resume that upon discharge.   CODE STATUS: The patient is a full code.   TIME SPENT: 35 minutes.    ____________________________ Rolly PancakeVivek J. Cherlynn KaiserSainani, MD vjs:dd D: 10/25/2013 16:04:18 ET T: 10/26/2013 00:05:20 ET JOB#: [id number]  cc: Rolly PancakeVivek J. Cherlynn KaiserSainani, MD, <Dictator> Kandyce RudMarcus Babaoff, MD Ezzard StandingPaul Y. Bluford Kaufmannh, MD Houston SirenVIVEK J Nason Conradt MD ELECTRONICALLY SIGNED 11/05/2013 10:43

## 2014-11-02 NOTE — Consult Note (Signed)
Chief Complaint:  Subjective/Chief Complaint Slow improvement. Still some LLQ abd pain   VITAL SIGNS/ANCILLARY NOTES: **Vital Signs.:   15-May-15 08:49  Vital Signs Type Q 4hr  Temperature Temperature (F) 97.2  Celsius 36.2  Temperature Source oral  Pulse Pulse 103  Respirations Respirations 20  Systolic BP Systolic BP 509  Diastolic BP (mmHg) Diastolic BP (mmHg) 80  Mean BP 106  Pulse Ox % Pulse Ox % 96  Pulse Ox Activity Level  At rest  Oxygen Delivery Room Air/ 21 %   Brief Assessment:  GEN no acute distress   Cardiac Regular   Respiratory clear BS   Gastrointestinal LLQ tenderness   Lab Results: Routine Chem:  15-May-15 05:44   Glucose, Serum  147  BUN  6  Creatinine (comp)  0.52  Sodium, Serum 136  Potassium, Serum 4.8  Chloride, Serum 104  CO2, Serum 26  Calcium (Total), Serum  7.7  Anion Gap  6  Osmolality (calc) 272  eGFR (African American) >60  eGFR (Non-African American) >60 (eGFR values <67mL/min/1.73 m2 may be an indication of chronic kidney disease (CKD). Calculated eGFR is useful in patients with stable renal function. The eGFR calculation will not be reliable in acutely ill patients when serum creatinine is changing rapidly. It is not useful in  patients on dialysis. The eGFR calculation may not be applicable to patients at the low and high extremes of body sizes, pregnant women, and vegetarians.)  Result Comment cbc - PATHOLOGIST TO REVIEW SMEAR. COMMENTS  - APPEAR ON REPORT WHEN COMPLETE.  Result(s) reported on 23 Nov 2013 at 07:01AM.  Routine Hem:  15-May-15 05:44   WBC (CBC) 4.9  RBC (CBC)  3.37  Hemoglobin (CBC)  10.4  Hematocrit (CBC)  31.5  Platelet Count (CBC) 337  MCV 93  MCH 30.7  MCHC 32.9  RDW 14.2  Bands 16  Segmented Neutrophils 60  Lymphocytes 8  Monocytes 1  Eosinophil 1  Metamyelocyte 6  Myelocyte 7  Promyelocyte 1  Diff Comment 2 RBCs APPEAR NORMAL  Diff Comment 3 NORMAL PLT MORPHOLGY  Result(s) reported on  23 Nov 2013 at 07:01AM.   Radiology Results: CT:    12-May-15 17:42, CT Abdomen and Pelvis With Contrast  CT Abdomen and Pelvis With Contrast   REASON FOR EXAM:    (1) worsening abd pain, distention; (2) worsening abd   pain, distention  COMMENTS:       PROCEDURE: CT  - CT ABDOMEN / PELVIS  W  - Nov 20 2013  5:42PM     CLINICAL DATA:  Worsening abdominal pain and distension.    EXAM:  CT ABDOMEN AND PELVIS WITH CONTRAST    TECHNIQUE:  Multidetector CT imaging of the abdomen and pelvis was performed  using the standard protocol following bolus administration of  intravenous contrast.  CONTRAST:  100 cc Isovue 370.    COMPARISON:  10/23/2013 CT.    FINDINGS:  Marked inflammation of the descending colon and sigmoid colon.  Limited for excluding underlying mass at the descending colon/  sigmoid colon junction. Presently, no well-defined drainable abscess  or free intraperitoneal air. Proximal to the colitis, fullness of  the remainder of colon and small bowel may represent reactive  changes to the colitis.    Contracted gallbladder without calcified gallstones. Dilated common  bile duct without pancreatic mass or calcified common bile duct  stone noted.  Splenic artery aneurysm measures up to 1.7 cm.    Basilar nodules greater on the  right measuring up to 5.2 mm. If the  patient is at high risk for bronchogenic carcinoma, follow-up chest  CT at 6-12 months is recommended. If the patient is at low risk for  bronchogenic carcinoma, follow-up chest CT at 12 months is  recommended. This recommendation follows the consensus statement:  Guidelines for Management of Small Pulmonary Nodules Detected on CT  Scans: A Statement from the[employer] Society as published in  Radiology 2005;237:395-400.    Small hiatal hernia.    Coronary artery calcifications.  Heart size within normal limits.  Atherosclerotic type changes of the abdominal aorta are without  aneurysmal dilation. Mild  atherosclerotic type changes origin of the  celiac artery, superior mesenteric artery and inferior mesenteric  artery.    No worrisome hepatic, splenic, renal or adrenal lesion.    Degenerative changes lumbar spine with disc protrusions at when you  through L5-S1. Tarlov cysts with remote bone    Small amount of gas within the bladder felt to be related to recent  manipulation after discussion with Dr. Jimmye Norman.     IMPRESSION:  Marked interval change with significant inflammation of the  descending colon and sigmoid colon. Limited for excluding underlying  mass at the descending colon/ sigmoid colon junction. Presently, no  well-defined drainable abscess or free intraperitoneal air. Proximal  to the colitis, fullness of the remainder of colon and small bowel  may represent reactive changes to the colitis.    Contracted gallbladder without calcified gallstones. Dilated common  bile duct without pancreatic mass or calcified common bile duct  stone noted.    Splenic artery aneurysm measures up to 1.7 cm.    Tiny basilar nodules with follow-up as noted above.    These results were called by telephone at the time of interpretation  on 11/20/2013 at 5:50 PM to Dr. Lenise Arena , who verbally  acknowledged these results.      Electronically Signed    By: Chauncey Cruel M.D.    On: 11/20/2013 17:50         Verified By: Doug Sou, M.D.,   Assessment/Plan:  Assessment/Plan:  Assessment Left sided colitis.   Plan Continue current meds. Recommend min 48hrs of IV steroids before switching to po prednisone. thanks   Electronic Signatures: Verdie Shire (MD)  (Signed 15-May-15 14:16)  Authored: Chief Complaint, VITAL SIGNS/ANCILLARY NOTES, Brief Assessment, Lab Results, Radiology Results, Assessment/Plan   Last Updated: 15-May-15 14:16 by Verdie Shire (MD)

## 2014-11-02 NOTE — H&P (Signed)
PATIENT NAME:  Carrie [name], Carrie [name] MR#:  [id number] DATE OF BIRTH:  Feb 12, 1930  DATE OF ADMISSION:  12/20/2013  PRIMARY CARE PHYSICIAN: Dr. Larwance SachsBabaoff  REFERRING PHYSICIAN: Dr. Lutricia FeilPaul Oh  CHIEF COMPLAINT: Persistent and worsening abdominal pain and failed outpatient therapy.   HISTORY OF PRESENT ILLNESS: The patient is a pleasant 81 year old Caucasian female with a couple of recent hospitalizations. Of note, the patient has a history of diverticulitis. She was initially admitted to the hospital on April 14 with diagnosis of acute diverticulitis, treated with IV antibiotics, discharged home and underwent an outpatient colo which showed possible colitis; however, it was a limited scope and it was only limited to sigmoid area as there was a lot of colonic wall edema. She was treated with Flagyl and Asacol and as pain persisted and became worse came into the hospital and got admitted May 12th. She got discharged on prednisone gradual taper with some antibiotics on May 18th. She had been taking the prednisone 10 mg daily however. She went to see her GI physician for not getting better. She has been having multiple loose stools, and she has some blood when she wipes herself, but no blood in the stool. She has up to 6 episodes of loose watery stools and has had 10 pound weight loss, has had fevers, chills, and night sweats. Therefore, I was asked by Dr. Bluford Kaufmannh to admit this patient directly for further work-up and IV steroids for possible colitis.   PAST MEDICAL HISTORY: 1.  Diverticulitis, possible colitis, ulcerative in nature.  2.  Arthritis.  3.  Restless leg syndrome.  4.  Osteoporosis.  5.  Osteoarthritis.   ALLERGIES: AZITHROMYCIN AND NITROFURANTOIN.   SOCIAL HISTORY: No tobacco, alcohol or drug use.   FAMILY HISTORY: Hypertension, diabetes and cancer runs in her family.   OUTPATIENT MEDICATIONS: She is on prednisone 10 mg daily currently, acetaminophen/hydrocodone 325/5 mg 2 times a day, calcium plus D 2  times a day, Centrum Silver once a day, cetirizine 10 mg p.r.n. daily, cranberry oral capsule 2 caps after each meal, Ecotrin 81 mg daily, Evista 60 mg daily, meloxicam 15 mg daily, methocarbamol 500 mg 4 times a day as needed, Muro-128 one drop to each affected eye 4 times a day, pramipexole 0.25 mg at bedtime, Robaxin 500 mg 4 times a day as needed, vitamin C 500 mg once a day, vitamin E 400 international units 2 caps once a day.  REVIEW OF SYSTEMS:  CONSTITUTIONAL: Positive for 10 pound weight loss, poor p.o. intake, fatigue, and abdominal pain.  EYES: Has chronic blurry vision.  ENT: No tinnitus or hearing loss.  RESPIRATORY: No cough, wheezing, or shortness of breath.  CARDIOVASCULAR: No chest pain, palpitations or swelling in the legs.  GASTROINTESTINAL: Positive for nausea, poor p.o. intake, abdominal pain that is more in the left lower quadrant, diarrhea, and blood when she wipes herself. No blood in the stool. No melena.  GENITOURINARY: Denies dysuria, hematuria.  HEMATOLOGIC AND LYMPHATIC: No anemia or easy bruising.  SKIN: No rashes.  MUSCULOSKELETAL: Has chronic arthritis. NEUROLOGIC: No focal weakness or numbness.  PSYCHIATRIC: Has been feeling depressed.   PHYSICAL EXAMINATION: VITAL SIGNS: Temperature on arrival 97.5, pulse rate 108, respiratory rate 18, blood pressure 146/71, O2 sat 97%.  GENERAL: The patient is an elderly female sitting in bed with flat affect, appears down, in no obvious distress.  HEENT: Normocephalic, atraumatic. Pupils are equal and reactive. Extraocular muscles intact. Dry mucous membranes.  NECK: Supple. No thyroid tenderness. No  cervical lymphadenopathy.  CARDIOVASCULAR: S1, S2 regular. No significant murmurs appreciated.  LUNGS: Clear to auscultation without wheezing, rhonchi, or rales.  ABDOMEN: Soft, mild left lower quadrant tenderness to deep palpation. No rebound or guarding. Hypoactive bowel sounds.  EXTREMITIES: No pitting edema.  NEUROLOGIC:  Cranial nerves II through XII grossly intact. Strength is 5/5 in all extremities. Sensation is intact to light touch.  PSYCHIATRIC: Awake, alert, and oriented. SKIN: No obvious rashes or lesions.   DIAGNOSTIC DATA: Labs and imaging: None. I have ordered labs and would consider getting a CAT scan done as well based on BUN, creatinine and GFR.   ASSESSMENT AND PLAN: We have a pleasant 81 year old with a couple of recent hospitalizations with admission in April for diverticulitis and then in May with colitis, possibly ulcerative, who was discharged on a prednisone taper, who comes in for not getting better. Of note, the patient is only on 10 mg prednisone not a gradual taper as the discharge summary had indicated. At this point, we would start the patient on 20 mg IV Solu-Medrol q. 8 hours. I have discussed the case with Dr. Bluford Kaufmann who is agreeable to this. We would start the patient on Cipro and Flagyl and some IV fluids. Would order baseline labs and if GFR allows would consider a CAT scan with contrast to see if perhaps she has diverticulitis, colitis, or see if she has an abscess of some kind. Would start her on some Zofran, continue her opiate for pain, and see what the labs show. We would order gastroenterology consult and Dr. Bluford Kaufmann is already aware. For deep vein thrombosis prophylaxis, she would be on SCDs and TEDs. As she has some blood in the stool, I would not place her on chemical prophylaxis.   TOTAL TIME SPENT: About 45 minutes.   ___________________________ Krystal Eaton, MD sa:sb D: 12/20/2013 15:17:15 ET T: 12/20/2013 16:12:22 ET JOB#: [id number]  cc: Krystal Eaton, MD, <Dictator> [name], MD Ezzard Standing. Bluford Kaufmann, MD Krystal Eaton MD ELECTRONICALLY SIGNED 01/08/2014 12:51

## 2014-11-02 NOTE — Consult Note (Signed)
Pt seen and examined. Please see Dawn Harrison's notes. Treated for possible diverticulitis last month. Some improvement afterwards. However, gradual decline since. Partial colonoscopy last week showed signif coltis with edema. Bx showed nonspecific colitis. Placed on asacol and cipro/flagyl Fri. Came to ER yest due to poor response. Pt feels better today with IVF and KCL. Also, on IV Abx. Abd distended with decreased bowel sounds. CT showed worsening left sided colitis. If no significant improvement by tomorrow, start IV steroids also in case patient does have ulcerative colitis. UC in patient of this age is unusual. Discussed with daughter in law, who is a GI nurse. Will follow. Thanks.  Electronic Signatures: Lutricia Feilh, Jimmy Stipes (MD)  (Signed on 13-May-15 15:43)  Authored  Last Updated: 13-May-15 15:43 by Lutricia Feilh, Kenon Delashmit (MD)

## 2015-08-20 ENCOUNTER — Ambulatory Visit: Payer: Self-pay | Admitting: Obstetrics and Gynecology

## 2015-08-20 ENCOUNTER — Encounter: Payer: Self-pay | Admitting: Obstetrics and Gynecology

## 2016-01-13 IMAGING — CT CT ABD-PELV W/ CM
2 of 5 series · 14 of 46 positions shown, 16 images · IV contrast (isovue)
Comparison: 10/23/2013 CT.

CLINICAL DATA: Worsening abdominal pain and distension.

EXAM:
CT ABDOMEN AND PELVIS WITH CONTRAST
TECHNIQUE: Multidetector CT imaging of the abdomen and pelvis was performed
using the standard protocol following bolus administration of
intravenous contrast.
CONTRAST:  100 cc Isovue 370.

[Series 2: routine abd pel with · axial · 0.58mm/px · z∈[-1070,-674]mm · 11 of 89 slices shown, 13 images]
[im 5/89  soft-tissue]
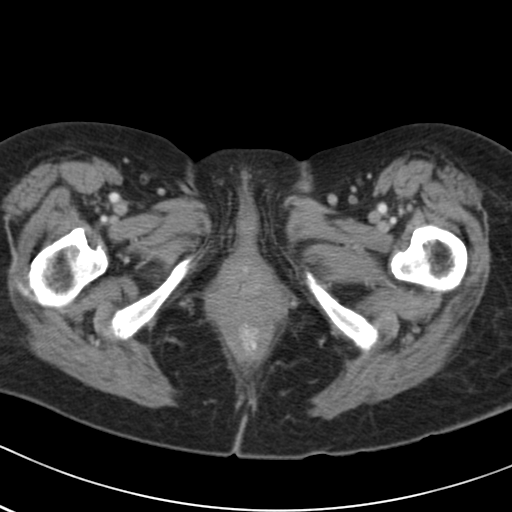
[im 5/89  bone]
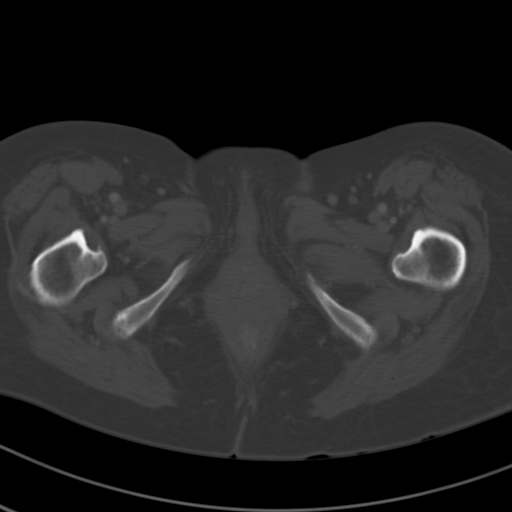
[im 14/89  soft-tissue]
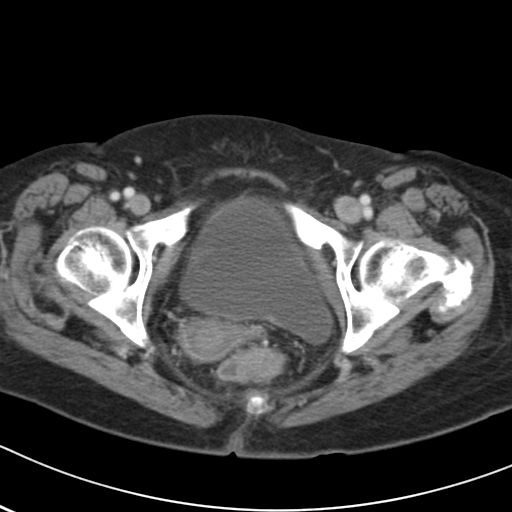
[im 24/89  soft-tissue]
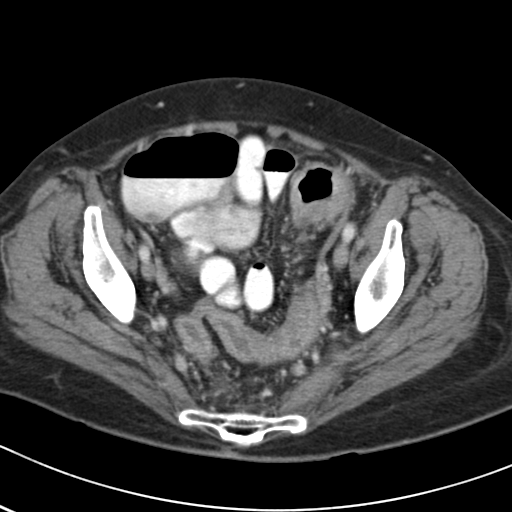
[im 28/89  soft-tissue]
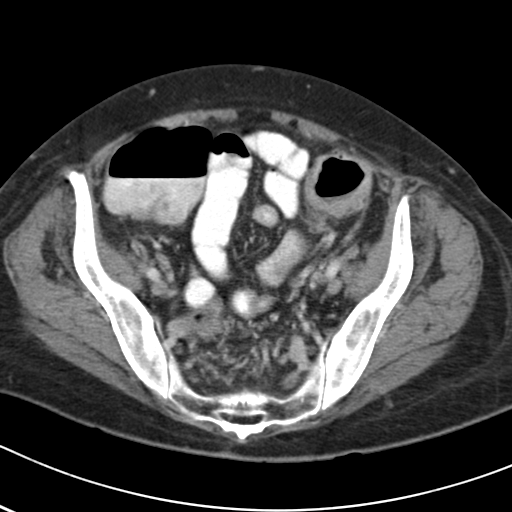
[im 38/89  soft-tissue]
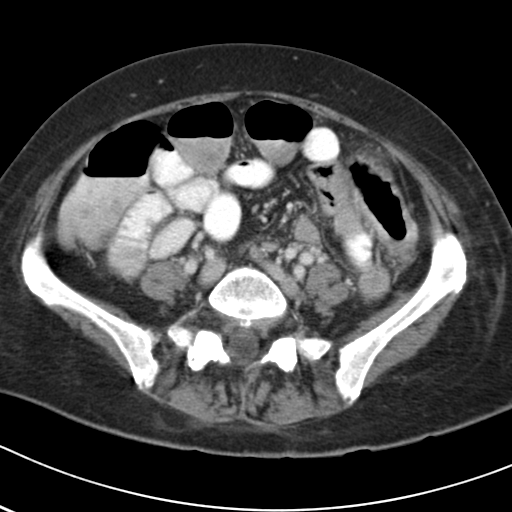
[im 47/89  soft-tissue]
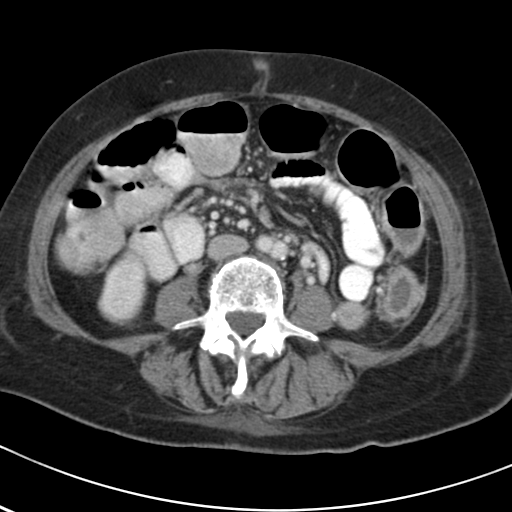
[im 51/89  soft-tissue]
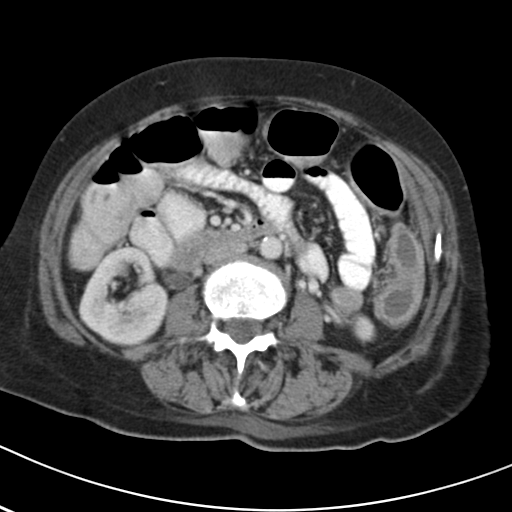
[im 61/89  soft-tissue]
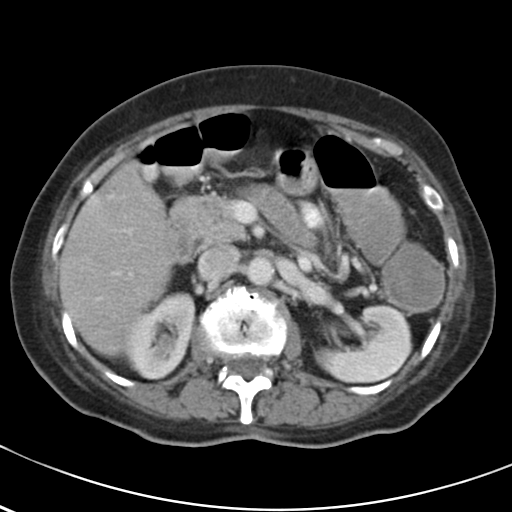
[im 65/89  soft-tissue]
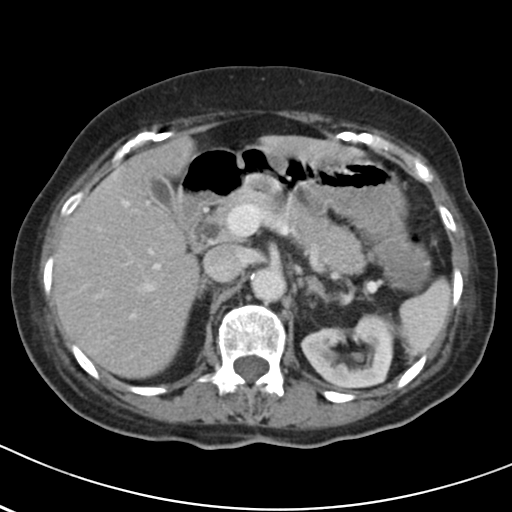
[im 65/89  bone]
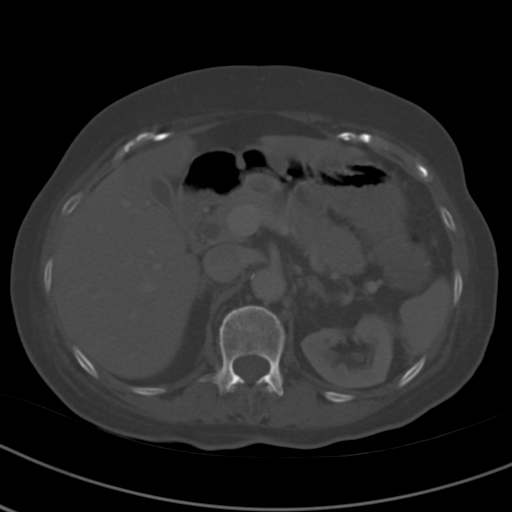
[im 75/89  soft-tissue]
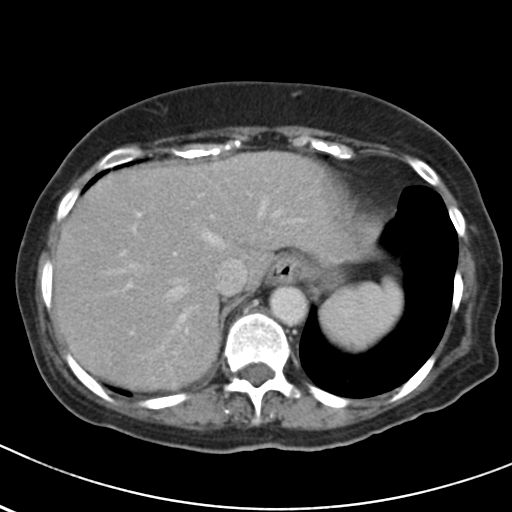
[im 84/89  soft-tissue]
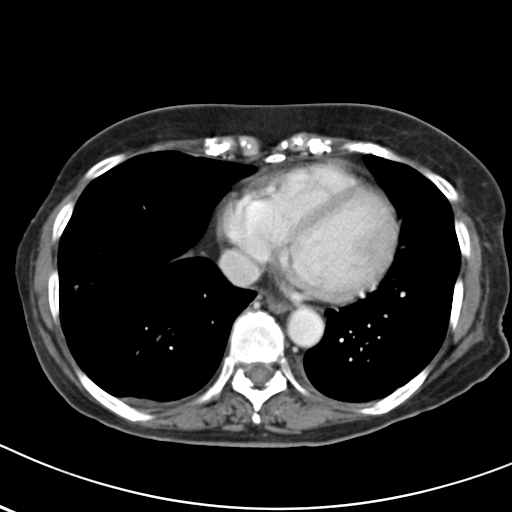

[Series 6: cor routine abd pel with · coronal · 0.58mm/px · 3 of 117 slices shown]
[im 39/117  soft-tissue]
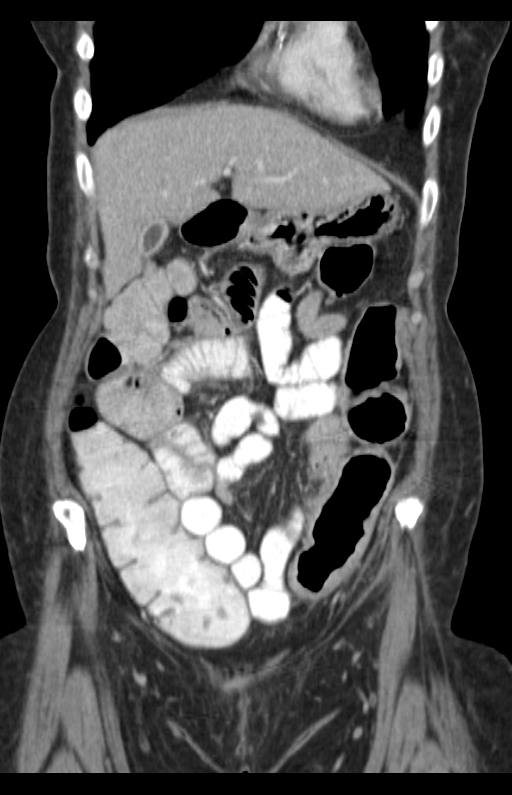
[im 52/117  soft-tissue]
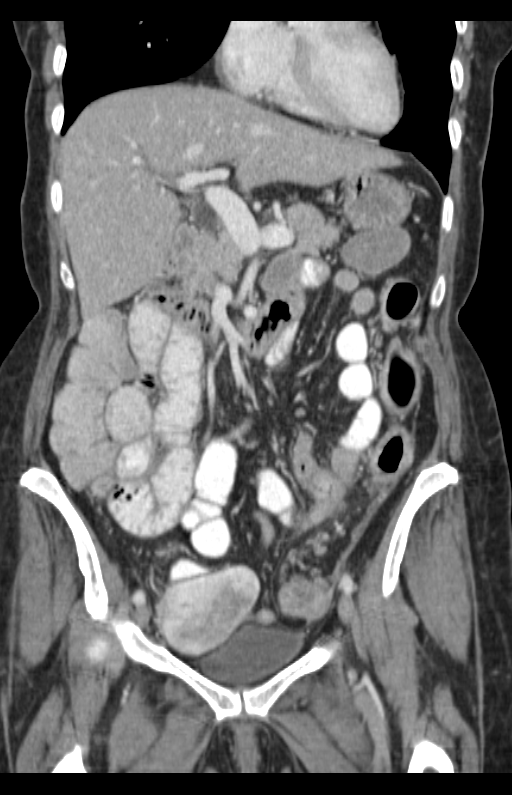
[im 65/117  soft-tissue]
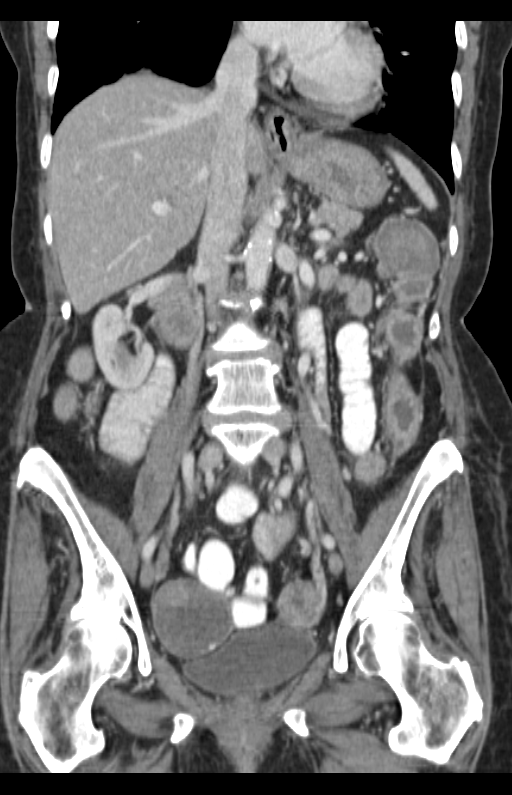

[14 of 46 positions shown; findings below may reference images not displayed]

FINDINGS: Marked inflammation of the descending colon and sigmoid colon.
Limited for excluding underlying mass at the descending colon/
sigmoid colon junction. Presently, no well-defined drainable abscess
or free intraperitoneal air. Proximal to the colitis, fullness of
the remainder of colon and small bowel may represent reactive
changes to the colitis.

Contracted gallbladder without calcified gallstones. Dilated common
bile duct without pancreatic mass or calcified common bile duct
stone noted.

Splenic artery aneurysm measures up to 1.7 cm.

Basilar nodules greater on the right measuring up to 5.2 mm. If the
patient is at high risk for bronchogenic carcinoma, follow-up chest
CT at 6-12 months is recommended. If the patient is at low risk for
bronchogenic carcinoma, follow-up chest CT at 12 months is
recommended. This recommendation follows the consensus statement:
Guidelines for Management of Small Pulmonary Nodules Detected on CT
Scans: A Statement from the [HOSPITAL] as published in

Small hiatal hernia.

Coronary artery calcifications.  Heart size within normal limits.

Atherosclerotic type changes of the abdominal aorta are without
aneurysmal dilation. Mild atherosclerotic type changes origin of the
celiac artery, superior mesenteric artery and inferior mesenteric
artery.

No worrisome hepatic, splenic, renal or adrenal lesion.

Degenerative changes lumbar spine with disc protrusions at when you
through L5-S1. Tarlov cysts with remote bone

Small amount of gas within the bladder felt to be related to recent
manipulation after discussion with Dr. Toorres.
IMPRESSION: Marked interval change with significant inflammation of the
descending colon and sigmoid colon. Limited for excluding underlying
mass at the descending colon/ sigmoid colon junction. Presently, no
well-defined drainable abscess or free intraperitoneal air. Proximal
to the colitis, fullness of the remainder of colon and small bowel
may represent reactive changes to the colitis.

Contracted gallbladder without calcified gallstones. Dilated common
bile duct without pancreatic mass or calcified common bile duct
stone noted.

Splenic artery aneurysm measures up to 1.7 cm.

Tiny basilar nodules with follow-up as noted above.

These results were called by telephone at the time of interpretation
on 11/20/2013 at [DATE] to Dr. SANGIL JEOHN , who verbally
acknowledged these results.

## 2017-07-12 ENCOUNTER — Other Ambulatory Visit: Payer: Self-pay

## 2017-07-12 ENCOUNTER — Emergency Department: Payer: Medicare Other

## 2017-07-12 ENCOUNTER — Emergency Department
Admission: EM | Admit: 2017-07-12 | Discharge: 2017-07-12 | Disposition: A | Discharge status: Home / Self Care | Payer: Medicare Other | Attending: Emergency Medicine | Admitting: Emergency Medicine

## 2017-07-12 ENCOUNTER — Encounter: Payer: Self-pay | Admitting: Emergency Medicine

## 2017-07-12 DIAGNOSIS — Z79899 Other long term (current) drug therapy: Secondary | ICD-10-CM | POA: Insufficient documentation

## 2017-07-12 DIAGNOSIS — I509 Heart failure, unspecified: Secondary | ICD-10-CM | POA: Diagnosis not present

## 2017-07-12 DIAGNOSIS — Z7982 Long term (current) use of aspirin: Secondary | ICD-10-CM | POA: Insufficient documentation

## 2017-07-12 DIAGNOSIS — Y999 Unspecified external cause status: Secondary | ICD-10-CM | POA: Insufficient documentation

## 2017-07-12 DIAGNOSIS — W19XXXA Unspecified fall, initial encounter: Secondary | ICD-10-CM | POA: Diagnosis not present

## 2017-07-12 DIAGNOSIS — Y929 Unspecified place or not applicable: Secondary | ICD-10-CM | POA: Diagnosis not present

## 2017-07-12 DIAGNOSIS — S6991XA Unspecified injury of right wrist, hand and finger(s), initial encounter: Secondary | ICD-10-CM | POA: Diagnosis present

## 2017-07-12 DIAGNOSIS — Y939 Activity, unspecified: Secondary | ICD-10-CM | POA: Insufficient documentation

## 2017-07-12 DIAGNOSIS — S63501A Unspecified sprain of right wrist, initial encounter: Secondary | ICD-10-CM | POA: Diagnosis not present

## 2017-07-12 DIAGNOSIS — S62101A Fracture of unspecified carpal bone, right wrist, initial encounter for closed fracture: Secondary | ICD-10-CM

## 2017-07-12 NOTE — ED Triage Notes (Signed)
R arm pain since fell walking across room about 2230. Remembers event. Points to R forearm at site of pain.

## 2017-07-12 NOTE — ED Provider Notes (Signed)
[facility] Emergency Department Provider Note  ____________________________________________   First MD Initiated Contact with Patient 07/12/17 1458     (approximate)  I have reviewed the triage vital signs and the nursing notes.   HISTORY  Chief Complaint Arm Pain    HPI ANYRA KAUFMAN is a 84 y.o. female states she fell last night, she is complaining of right forearm pain and left upper arm pain, she did not lose consciousness she does not have a headache and her son states she has been acting as normal, she is not on blood thinners, however she does take aspirin a day, she denies vomiting or nausea  Past Medical History:  Diagnosis Date  . Allergic rhinitis   . Anemia   . Congestive heart failure (CHF) (HCC)   . Diverticulosis   . Hyperlipidemia   . Osteoporosis   . Recurrent UTI     There are no active problems to display for this patient.   Past Surgical History:  Procedure Laterality Date  . ROTATOR CUFF REPAIR Right     Prior to Admission medications   Medication Sig Start Date End Date Taking? Authorizing Provider  aspirin 81 MG EC tablet Take 81 mg by mouth daily. Swallow whole.    [provider]  Calcium Carbonate Antacid (CALCIUM ANTACID PO) Take by mouth.    [provider]  HYDROcodone-acetaminophen (NORCO/VICODIN) 5-325 MG tablet Take 1 tablet by mouth every 6 (six) hours as needed for moderate pain.    [provider]  methocarbamol (ROBAXIN) 500 MG tablet Take 500 mg by mouth 4 (four) times daily.    [provider]  Multiple Vitamin (MULTIVITAMIN) tablet Take 1 tablet by mouth daily.    [provider]  raloxifene (EVISTA) 60 MG tablet Take 60 mg by mouth daily.    [provider]  sodium chloride (MURO 128) 2 % ophthalmic solution 1 drop.    [provider]  vitamin C (ASCORBIC ACID) 250 MG tablet Take 250 mg by mouth daily.    [provider]  VITAMIN  E, TOPICAL, CREA Apply topically.    [provider]    Allergies Azithromycin; Macrobid [nitrofurantoin monohyd macro]; and Nitrofurantoin  No family history on file.  Social History Social History   Tobacco Use  . Smoking status: Never Smoker  Substance Use Topics  . Alcohol use: No    Alcohol/week: 0.0 oz  . Drug use: No    Review of Systems  Constitutional: No fever/chills Eyes: No visual changes. ENT: No sore throat. Respiratory: Denies cough Genitourinary: Negative for dysuria. Musculoskeletal: Negative for back pain.  Positive for right arm pain and left upper arm pain Skin: Negative for rash.    ____________________________________________   PHYSICAL EXAM:  VITAL SIGNS: ED Triage Vitals  Enc Vitals Group     BP 07/12/17 1420 (!) 153/55     Pulse Rate 07/12/17 1420 (!) 102     Resp 07/12/17 1420 18     Temp 07/12/17 1420 98.7 F (37.1 C)     Temp Source 07/12/17 1420 Oral     SpO2 07/12/17 1420 98 %     Weight 07/12/17 1421 130 lb (59 kg)     Height 07/12/17 1421 5\' 2"  (1.575 m)     Head Circumference --      Peak Flow --      Pain Score 07/12/17 1420 8     Pain Loc --  Pain Edu? --      Excl. in GC? --     Constitutional: Alert and oriented. Well appearing and in no acute distress.  Patient has bruising on the upper lip, left upper arm, she is answering questions in an appropriate manner Eyes: Conjunctivae are normal. perrl Head: Atraumatic. Nose: No congestion/rhinnorhea. Mouth/Throat: Mucous membranes are moist.   Cardiovascular: Normal rate, regular rhythm.  Heart sounds are normal Respiratory: Normal respiratory effort.  No retractions, lungs are clear to auscultation GU: deferred Musculoskeletal: FROM all extremities, warm and well perfused Neurologic:  Normal speech and language.  Skin:  Skin is warm, dry and intact. No rash noted.  Positive for bruising to the left upper lip and left upper arm Psychiatric: Mood and affect  are normal. Speech and behavior are normal.  ____________________________________________   LABS (all labs ordered are listed, but only abnormal results are displayed)  Labs Reviewed - No data to display ____________________________________________   ____________________________________________  RADIOLOGY  X-ray of the forearm shows a demineralization questionable fracture at the distal radius, radiologist read it is negative however I have concerns of fracture due to the amount of swelling and pain noted in the area x-ray of the left upper arm is negative for fracture  X-ray of the left humerus is negative for fracture  ____________________________________________   PROCEDURES  Procedure(s) performed:   A Velcro cockup splint was applied to the right wrist by the tech, the patient is neurovascularly intact post splint application    ____________________________________________   INITIAL IMPRESSION / ASSESSMENT AND PLAN / ED COURSE  Pertinent labs & imaging results that were available during my care of the patient were reviewed by me and considered in my medical decision making (see chart for details).  Patient is a 84 year old female who fell last night, complaining of right arm pain and left upper arm pain, on x-ray of the left humerus is negative for fracture, the right forearm shows a demineralization but concerning the fracture in the area due to the amount of swelling and pain noted by the patient, diagnosis is acute fracture to the right wrist, Velcro OCL was applied, a disc of the images was given to the patient as her son wants to take her back to [place] to stay with them for a while, they are to follow-up with orthopedics in the [place] area, the patient was discharged in stable condition     As part of my medical decision making, I reviewed the following data within the electronic MEDICAL RECORD NUMBER  ____________________________________________   FINAL  CLINICAL IMPRESSION(S) / ED DIAGNOSES  Final diagnoses:  Wrist fracture, closed, right, initial encounter  Fall, initial encounter      NEW MEDICATIONS STARTED DURING THIS VISIT:  This SmartLink is deprecated. Use AVSMEDLIST instead to display the medication list for a patient.   Note:  This document was prepared using Dragon voice recognition software and may include unintentional dictation errors.    Faythe GheeFisher, Susan W, PA-C 07/12/17 2258    Merrily Brittleifenbark, Neil, MD 07/13/17 Moses [email or web address]

## 2017-07-12 NOTE — Discharge Instructions (Signed)
Follow-up with orthopedic in [place] per your son, a disc of the x-rays will be sent with you, the splint is to be worn as much as possible, you may take it off to bathe only, take Tylenol as needed for pain, return to the emergency department if you are having any other problems

## 2017-07-12 NOTE — ED Notes (Signed)
See triage note   Larey SeatFell while walking last pm  Landed on right arm  Having pain to right forearm  Min swelling noted  No deformity

## 2017-10-11 NOTE — Progress Notes (Signed)
10/12/2017 10:21 AM   Carrie [name] 01/09/1930 [id number]  Referring provider: Kandyce RudBabaoff, Marcus, MD [phone] [address] [facility] [place] [facility] [place], [place] [id number]  Chief Complaint  Patient presents with  . Hematuria    HPI: Patient is an 84 year old Caucasian female who was referred to us by Dr. Feliberto GottronSchermerhorn for urethral prolapse and gross hematuria with her daughter-in-law, Carrie [name].    She is experiencing pain in the vaginal area x 2 weeks.  She states the pain is lessening.   She is experiencing urinary frequency x , dysuria (intermittently, occurring every few months), nocturia x 2, incontinence (urge and stress, two thick pads at night) and gross hematuria for one week intermittently.  Her UA is positive for 11-30 RBC's and calcium oxalate crystals.  Her PVR is 0 mL.    She is not drinking a lot of water, she drinks one sprite every day, one cup of coffee in the am and afternoon, cranberry or apple juices.  She does not drink alcohol.    She reports that she has recurrent UTI's.  She has had two documented positive urine culture results.    She is not a smoker.      PMH: Past Medical History:  Diagnosis Date  . Allergic rhinitis   . Anemia   . Congestive heart failure (CHF) (HCC)   . Diverticulosis   . Hyperlipidemia   . Osteoporosis   . Recurrent UTI     Surgical History: Past Surgical History:  Procedure Laterality Date  . ROTATOR CUFF REPAIR Right     Home Medications:  Allergies as of 10/12/2017      Reactions   Azithromycin Other (See Comments)   diarrhea   Macrobid [nitrofurantoin Monohyd Macro] Rash   Nitrofurantoin Rash      Medication List        Accurate as of 10/12/17 10:21 AM. Always use your most recent med list.          aspirin 81 MG EC tablet Take 81 mg by mouth daily. Swallow whole.   CALCIUM ANTACID PO Take by mouth.   ciprofloxacin 250 MG tablet Commonly known as:  CIPRO   Cranberry 1000 MG  Caps Take by mouth.   HYDROcodone-acetaminophen 5-325 MG tablet Commonly known as:  NORCO/VICODIN Take 1 tablet by mouth every 6 (six) hours as needed for moderate pain.   mesalamine 1.2 g EC tablet Commonly known as:  LIALDA   methocarbamol 500 MG tablet Commonly known as:  ROBAXIN Take 500 mg by mouth 4 (four) times daily.   multivitamin tablet Take 1 tablet by mouth daily.   raloxifene 60 MG tablet Commonly known as:  EVISTA Take 60 mg by mouth daily.   sodium chloride 2 % ophthalmic solution Commonly known as:  MURO 128 1 drop.   vitamin C 250 MG tablet Commonly known as:  ASCORBIC ACID Take 250 mg by mouth daily.   VITAMIN E (TOPICAL) Crea Apply topically.       Allergies:  Allergies  Allergen Reactions  . Azithromycin Other (See Comments)    diarrhea   . Macrobid [Nitrofurantoin Monohyd Macro] Rash  . Nitrofurantoin Rash    Family History: Family History  Problem Relation Age of Onset  . Bladder Cancer Neg Hx   . Kidney cancer Neg Hx     Social History:  reports that she has never smoked. She has never used smokeless tobacco. She reports that she does not drink  alcohol or use drugs.  ROS: UROLOGY Frequent Urination?: Yes Hard to postpone urination?: No Burning/pain with urination?: Yes Get up at night to urinate?: Yes Leakage of urine?: Yes Urine stream starts and stops?: No Trouble starting stream?: No Do you have to strain to urinate?: No Blood in urine?: Yes Urinary tract infection?: Yes Sexually transmitted disease?: No Injury to kidneys or bladder?: No Painful intercourse?: No Weak stream?: No Currently pregnant?: No Vaginal bleeding?: No Last menstrual period?: n  Gastrointestinal Nausea?: No Vomiting?: No Indigestion/heartburn?: Yes Diarrhea?: No Constipation?: No  Constitutional Fever: No Night sweats?: No Weight loss?: No Fatigue?: No  Skin Skin rash/lesions?: No Itching?: No  Eyes Blurred vision?: No Double  vision?: No  Ears/Nose/Throat Sore throat?: No Sinus problems?: No  Hematologic/Lymphatic Swollen glands?: No Easy bruising?: No  Cardiovascular Leg swelling?: No Chest pain?: No  Respiratory Cough?: No Shortness of breath?: No  Endocrine Excessive thirst?: No  Musculoskeletal Back pain?: No Joint pain?: No  Neurological Headaches?: No Dizziness?: No  Psychologic Depression?: Yes Anxiety?: No  Physical Exam: BP 138/70 (BP Location: Right Arm, Patient Position: Sitting, Cuff Size: Normal)   Pulse 99   Ht 5\' 2"  (1.575 m)   Wt 125 lb 3.2 oz (56.8 kg)   BMI 22.90 kg/m   Constitutional: Well nourished. Alert and oriented, No acute distress. HEENT: [place] AT, moist mucus membranes. Trachea midline, no masses. Cardiovascular: No clubbing, cyanosis, or edema. Respiratory: Normal respiratory effort, no increased work of breathing. GI: Abdomen is soft, non tender, non distended, no abdominal masses. Liver and spleen not palpable.  No hernias appreciated.  Stool sample for occult testing is not indicated.   GU: No CVA tenderness.  No bladder fullness or masses.  Atrophic external genitalia, normal pubic hair distribution, no lesions.  Circumferential, bright-red nodule around the meatus making a donut shape around the urethral orifice.  Tender to palpation.  No urethral masses, tenderness and/or tenderness. No bladder fullness, tenderness or masses. Normal vagina mucosa, good estrogen effect, no discharge, no lesions, good pelvic support, no cystocele or rectocele noted.  No cervical motion tenderness.  Uterus is freely mobile and non-fixed.  No adnexal/parametria masses or tenderness noted.  Anus and perineum are without rashes or lesions.    Skin: No rashes, bruises or suspicious lesions.  prolapse  Lymph: No cervical or inguinal adenopathy. Neurologic: Grossly intact, no focal deficits, moving all 4 extremities. Psychiatric: Normal mood and affect.  Laboratory Data: Lab  Results  Component Value Date   WBC 15.3 (H) 12/21/2013   HGB 8.6 (L) 12/21/2013   HCT 26.2 (L) 12/21/2013   MCV 92 12/21/2013   PLT 403 12/21/2013    Lab Results  Component Value Date   CREATININE 0.61 12/21/2013    No results found for: PSA  No results found for: TESTOSTERONE  No results found for: HGBA1C  Lab Results  Component Value Date   TSH 0.247 (L) 12/21/2013    No results found for: CHOL, HDL, CHOLHDL, VLDL, LDLCALC  Lab Results  Component Value Date   AST 22 12/20/2013   Lab Results  Component Value Date   ALT 18 12/20/2013   No components found for: ALKALINEPHOPHATASE No components found for: BILIRUBINTOTAL  No results found for: ESTRADIOL  Urinalysis 11-30 RBC's.  Calcium oxalate crystals.  See Epic.    I have reviewed the labs.   Pertinent Imaging: Results for ELLAMAE, LYBECK (MRN [id number]) as of 10/12/2017 21:33  Ref. Range 10/12/2017 09:48  Scan Result  Unknown 0       Assessment & Plan:   1. Gross hematuria  Patient with bloody discharge - denies any red colored urine - it could be eminating from the prolapse, uterus or bladder - will continue to monitor - if persists will pursue hematuria work up  Plans for abdominal ultrasound to evaluate uterus  2. Urethral prolapse Patient denies any history of breast cancer, blood clots or stroke Will have patient apply 0.5 grams of vaginal estrogen cream to prolapse nightly RTC in 2 weeks for exam    Return in about 2 weeks (around 10/26/2017) for recheck .  These notes generated with voice recognition software. I apologize for typographical errors.  Michiel Cowboy, PA-C  [facility] [address] [place], [place] [address] [phone]

## 2017-10-12 ENCOUNTER — Encounter: Payer: Self-pay | Admitting: Urology

## 2017-10-12 ENCOUNTER — Ambulatory Visit (INDEPENDENT_AMBULATORY_CARE_PROVIDER_SITE_OTHER): Payer: Medicare Other | Admitting: Urology

## 2017-10-12 VITALS — BP 138/70 | HR 99 | Ht 62.0 in | Wt 125.2 lb

## 2017-10-12 DIAGNOSIS — K529 Noninfective gastroenteritis and colitis, unspecified: Secondary | ICD-10-CM | POA: Insufficient documentation

## 2017-10-12 DIAGNOSIS — R310 Gross hematuria: Secondary | ICD-10-CM

## 2017-10-12 LAB — MICROSCOPIC EXAMINATION

## 2017-10-12 LAB — URINALYSIS, COMPLETE
Bilirubin, UA: NEGATIVE
Glucose, UA: NEGATIVE
Ketones, UA: NEGATIVE
NITRITE UA: NEGATIVE
PH UA: 5 (ref 5.0–7.5)
Specific Gravity, UA: >1.03 — ABNORMAL HIGH (ref 1.005–1.030)
UUROB: 0.2 mg/dL (ref 0.2–1.0)

## 2017-10-12 LAB — BLADDER SCAN AMB NON-IMAGING: SCAN RESULT: 0

## 2017-10-13 ENCOUNTER — Ambulatory Visit: Payer: Medicare Other | Admitting: Urology

## 2017-10-14 LAB — CULTURE, URINE COMPREHENSIVE

## 2017-10-31 ENCOUNTER — Ambulatory Visit: Payer: Medicare Other | Admitting: Urology

## 2017-11-01 ENCOUNTER — Ambulatory Visit: Payer: Medicare Other | Admitting: Urology

## 2017-11-16 NOTE — Progress Notes (Signed)
11/21/2017 3:50 PM   Carrie [name] [birth year] [id number]  Referring provider: [name], MD [phone] [address] [name] Medicine [place], [place] [address]  Chief Complaint  Patient presents with  . Hematuria    HPI: 84 yo WF with urethral prolapse and gross hematuria who presents today for follow up with her daughter-in-law, Carrie [name].  Background history Patient is an 84 year old Caucasian female who was referred to [place] by Dr. Feliberto [name] for urethral prolapse and gross hematuria with her daughter-in-law, Carrie [name].  She is experiencing pain in the vaginal area x 2 weeks.  She states the pain is lessening.  She is experiencing urinary frequency x , dysuria (intermittently, occurring every few months), nocturia x 2, incontinence (urge and stress, two thick pads at night) and gross hematuria for one week intermittently.  Her UA is positive for 11-30 RBC's and calcium oxalate crystals.  Her PVR is 0 mL.  She is not drinking a lot of water, she drinks one sprite every day, one cup of coffee in the am and afternoon, cranberry or apple juices.  She does not drink alcohol.   She reports that she has recurrent UTI's.  She has had two documented positive urine culture results.  She is not a smoker.  Urine culture is negative.  She was started on vaginal estrogen cream at her initial visit.    Today, she states she is not experiencing any more gross hematuria.  She also is no longer experiencing pain in the vaginal area.  She is still experiencing urinary incontinence, but she is not finding it bothersome to her at this time.  Patient denies any dysuria or suprapubic/flank pain.  Patient denies any fevers, chills, nausea or vomiting.   PMH: Past Medical History:  Diagnosis Date  . Allergic rhinitis   . Anemia   . Congestive heart failure (CHF) (HCC)   . Diverticulosis   . Hyperlipidemia   . Osteoporosis   . Recurrent UTI     Surgical History: Past Surgical  History:  Procedure Laterality Date  . ROTATOR CUFF REPAIR Right     Home Medications:  Allergies as of 11/21/2017      Reactions   Azithromycin Other (See Comments)   diarrhea   Macrobid [nitrofurantoin Monohyd Macro] Rash   Nitrofurantoin Rash      Medication List        Accurate as of 11/21/17  3:50 PM. Always use your most recent med list.          aspirin 81 MG EC tablet Take 81 mg by mouth daily. Swallow whole.   CALCIUM ANTACID PO Take by mouth.   Cranberry 1000 MG Caps Take by mouth.   mesalamine 1.2 g EC tablet Commonly known as:  LIALDA   methocarbamol 500 MG tablet Commonly known as:  ROBAXIN Take 500 mg by mouth 4 (four) times daily.   multivitamin tablet Take 1 tablet by mouth daily.   raloxifene 60 MG tablet Commonly known as:  EVISTA Take 60 mg by mouth daily.   sodium chloride 2 % ophthalmic solution Commonly known as:  MURO 128 1 drop.   vitamin C 250 MG tablet Commonly known as:  ASCORBIC ACID Take 250 mg by mouth daily.   VITAMIN E (TOPICAL) Crea Apply topically.       Allergies:  Allergies  Allergen Reactions  . Azithromycin Other (See Comments)    diarrhea   . Macrobid [Nitrofurantoin Monohyd Macro] Rash  .  Nitrofurantoin Rash    Family History: Family History  Problem Relation Age of Onset  . Bladder Cancer Neg Hx   . Kidney cancer Neg Hx     Social History:  reports that she has never smoked. She has never used smokeless tobacco. She reports that she does not drink alcohol or use drugs.  ROS: UROLOGY Frequent Urination?: No Hard to postpone urination?: No Burning/pain with urination?: No Get up at night to urinate?: No Leakage of urine?: No Urine stream starts and stops?: No Trouble starting stream?: No Do you have to strain to urinate?: No Blood in urine?: No Urinary tract infection?: No Sexually transmitted disease?: No Injury to kidneys or bladder?: No Painful intercourse?: No Weak stream?:  No Currently pregnant?: No Vaginal bleeding?: No Last menstrual period?: n  Gastrointestinal Nausea?: No Vomiting?: No Indigestion/heartburn?: Yes Diarrhea?: No Constipation?: No  Constitutional Fever: No Night sweats?: No Weight loss?: No Fatigue?: No  Skin Skin rash/lesions?: No Itching?: No  Eyes Blurred vision?: No Double vision?: No  Ears/Nose/Throat Sore throat?: No Sinus problems?: No  Hematologic/Lymphatic Swollen glands?: No Easy bruising?: No  Cardiovascular Leg swelling?: No Chest pain?: No  Respiratory Cough?: Yes Shortness of breath?: No  Endocrine Excessive thirst?: No  Musculoskeletal Back pain?: Yes Joint pain?: Yes  Neurological Headaches?: Yes Dizziness?: Yes  Psychologic Depression?: Yes Anxiety?: No  Physical Exam: BP (!) 166/73   Pulse (!) 112   Ht  (1.575 m)   Wt 127 lb (57.6 kg)   BMI 23.23 kg/m   Constitutional: Well nourished. Alert and oriented, No acute distress. HEENT:  AT, moist mucus membranes. Trachea midline, no masses. Cardiovascular: No clubbing, cyanosis, or edema. Respiratory: Normal respiratory effort, no increased work of breathing. GI: Abdomen is soft, non tender, non distended, no abdominal masses. Liver and spleen not palpable.  No hernias appreciated.  Stool sample for occult testing is not indicated.   GU: No CVA tenderness.  No bladder fullness or masses.  Atrophic external genitalia, normal pubic hair distribution, no lesions.  Urethral prolapse not appreciated on today's exam.  No urethral masses, tenderness and/or tenderness.  Skin: No rashes, bruises or suspicious lesions.  prolapse  Lymph: No cervical or inguinal adenopathy. Neurologic: Grossly intact, no focal deficits, moving all 4 extremities. Psychiatric: Normal mood and affect.  Laboratory Data: Lab Results  Component Value Date   WBC 15.3 (H) 12/21/2013   HGB 8.6 (L) 12/21/2013   HCT 26.2 (L) 12/21/2013   MCV 92 12/21/2013    PLT 403 12/21/2013    Lab Results  Component Value Date   CREATININE 0.61 12/21/2013    No results found for: PSA  No results found for: TESTOSTERONE  No results found for: HGBA1C  Lab Results  Component Value Date   TSH 0.247 (L) 12/21/2013    No results found for: CHOL, HDL, CHOLHDL, VLDL, LDLCALC  Lab Results  Component Value Date   AST 22 12/20/2013   Lab Results  Component Value Date   ALT 18 12/20/2013   No components found for: ALKALINEPHOPHATASE No components found for: BILIRUBINTOTAL  No results found for: ESTRADIOL  I have reviewed the labs.  Assessment & Plan:   1. Gross hematuria  Resolved Patient will contact us if she should experience any further gross hematuria  2. Urethral prolapse Patient denies any history of breast cancer, blood clots or stroke Will have patient apply 0.5 grams of vaginal estrogen cream on Monday, Wednesday and Friday nights RTC in 2  months for exam    Return in about 3 months (around 02/21/2018) for exam.  These notes generated with voice recognition software. I apologize for typographical errors.  Michiel Cowboy, PA-C  [facility] [address] [place], [place] [address] [phone]

## 2017-11-21 ENCOUNTER — Encounter: Payer: Self-pay | Admitting: Urology

## 2017-11-21 ENCOUNTER — Ambulatory Visit (INDEPENDENT_AMBULATORY_CARE_PROVIDER_SITE_OTHER): Payer: Medicare Other | Admitting: Urology

## 2017-11-21 VITALS — BP 166/73 | HR 112 | Ht 62.0 in | Wt 127.0 lb

## 2017-11-21 DIAGNOSIS — R310 Gross hematuria: Secondary | ICD-10-CM | POA: Diagnosis not present

## 2017-11-21 DIAGNOSIS — N368 Other specified disorders of urethra: Secondary | ICD-10-CM | POA: Diagnosis not present

## 2017-11-21 NOTE — Patient Instructions (Signed)
Apply the vaginal estrogen cream three nights weekly (Monday, Wednesday and Friday nights) with your finger tip

## 2018-02-20 NOTE — Progress Notes (Signed)
02/21/2018 2:06 PM   Carrie [name] Jun 22, 1930 [id number]  Referring provider: [name], MD [phone] [name] [place], [place] [address]  Chief Complaint  Patient presents with  . Follow-up    HPI: 84 yo WF with an urethral prolapse and a history of gross hematuria who presents today for follow up with her daughter-in-law, [name].  Background history Patient is an 84 year old Caucasian female who was referred to [place] by Dr. Feliberto Gottron for urethral prolapse and gross hematuria with her daughter-in-law, [name].  She is experiencing pain in the vaginal area x 2 weeks.  She states the pain is lessening.  She is experiencing urinary frequency x , dysuria (intermittently, occurring every few months), nocturia x 2, incontinence (urge and stress, two thick pads at night) and gross hematuria for one week intermittently.  Her UA is positive for 11-30 RBC's and calcium oxalate crystals.  Her PVR is 0 mL.  She is not drinking a lot of water, she drinks one sprite every day, one cup of coffee in the am and afternoon, cranberry or apple juices.  She does not drink alcohol.   She reports that she has recurrent UTI's.  She has had two documented positive urine culture results.  She is not a smoker.  Urine culture is negative.  She was started on vaginal estrogen cream at her initial visit.    Today, she states she is not experiencing any more gross hematuria.  She also is no longer experiencing pain in the vaginal area.  She is still experiencing urinary incontinence, but she is not finding it bothersome to her at this time.  Patient denies any dysuria or suprapubic/flank pain.  Patient denies any fevers, chills, nausea or vomiting.   PMH: Past Medical History:  Diagnosis Date  . Allergic rhinitis   . Anemia   . Congestive heart failure (CHF) (HCC)   . Diverticulosis   . Hyperlipidemia   . Osteoporosis   . Recurrent UTI     Surgical  History: Past Surgical History:  Procedure Laterality Date  . ROTATOR CUFF REPAIR Right     Home Medications:  Allergies as of 02/21/2018      Reactions   Azithromycin Other (See Comments)   diarrhea   Macrobid [nitrofurantoin Monohyd Macro] Rash   Nitrofurantoin Rash      Medication List        Accurate as of 02/21/18  2:06 PM. Always use your most recent med list.          aspirin 81 MG EC tablet Take 81 mg by mouth daily. Swallow whole.   CALCIUM ANTACID PO Take by mouth.   Cranberry 1000 MG Caps Take by mouth.   mesalamine 1.2 g EC tablet Commonly known as:  LIALDA   methocarbamol 500 MG tablet Commonly known as:  ROBAXIN Take 500 mg by mouth 4 (four) times daily.   multivitamin tablet Take 1 tablet by mouth daily.   raloxifene 60 MG tablet Commonly known as:  EVISTA Take 60 mg by mouth daily.   sodium chloride 2 % ophthalmic solution Commonly known as:  MURO 128 1 drop.   vitamin C 250 MG tablet Commonly known as:  ASCORBIC ACID Take 250 mg by mouth daily.   VITAMIN E (TOPICAL) Crea Apply topically.       Allergies:  Allergies  Allergen Reactions  . Azithromycin Other (See Comments)    diarrhea   . Macrobid Dynegy  Monohyd Macro] Rash  . Nitrofurantoin Rash    Family History: Family History  Problem Relation Age of Onset  . Bladder Cancer Neg Hx   . Kidney cancer Neg Hx     Social History:  reports that she has never smoked. She has never used smokeless tobacco. She reports that she does not drink alcohol or use drugs.  ROS: UROLOGY Frequent Urination?: Yes Hard to postpone urination?: No Burning/pain with urination?: No Get up at night to urinate?: Yes Leakage of urine?: No Urine stream starts and stops?: No Trouble starting stream?: No Do you have to strain to urinate?: No Blood in urine?: No Urinary tract infection?: No Sexually transmitted disease?: No Injury to kidneys or bladder?: No Painful intercourse?:  No Weak stream?: No Currently pregnant?: No Vaginal bleeding?: No Last menstrual period?: n  Gastrointestinal Nausea?: No Vomiting?: No Indigestion/heartburn?: Yes Diarrhea?: No Constipation?: No  Constitutional Fever: No Night sweats?: No Weight loss?: No Fatigue?: Yes  Skin Skin rash/lesions?: No Itching?: No  Eyes Blurred vision?: No Double vision?: No  Ears/Nose/Throat Sore throat?: No Sinus problems?: Yes  Hematologic/Lymphatic Swollen glands?: No Easy bruising?: No  Cardiovascular Leg swelling?: No Chest pain?: No  Respiratory Cough?: No Shortness of breath?: No  Endocrine Excessive thirst?: No  Musculoskeletal Back pain?: Yes Joint pain?: Yes  Neurological Headaches?: Yes Dizziness?: No  Psychologic Depression?: Yes Anxiety?: No  Physical Exam: BP 122/82   Pulse 63   Ht 5\' 2"  (1.575 m)   Wt 130 lb 4.8 oz (59.1 kg)   BMI 23.83 kg/m   Constitutional: Well nourished. Alert and oriented, No acute distress. HEENT: [place] AT, moist mucus membranes. Trachea midline, no masses. Cardiovascular: No clubbing, cyanosis, or edema. Respiratory: Normal respiratory effort, no increased work of breathing. GI: Abdomen is soft, non tender, non distended, no abdominal masses. Liver and spleen not palpable.  No hernias appreciated.  Stool sample for occult testing is not indicated.   GU: No CVA tenderness.  No bladder fullness or masses.  Atrophic external genitalia, normal pubic hair distribution, no lesions.  Normal urethral meatus, no lesions, no prolapse, no discharge.   No urethral masses, tenderness and/or tenderness. No bladder fullness, tenderness or masses. Pale vagina mucosa, poor estrogen effect, no discharge, no lesions, good pelvic  Anus and perineum are without rashes or lesions.    Skin: No rashes, bruises or suspicious lesions. Lymph: No cervical or inguinal adenopathy. Neurologic: Grossly intact, no focal deficits, moving all 4  extremities. Psychiatric: Normal mood and affect.   Laboratory Data: Lab Results  Component Value Date   WBC 15.3 (H) 12/21/2013   HGB 8.6 (L) 12/21/2013   HCT 26.2 (L) 12/21/2013   MCV 92 12/21/2013   PLT 403 12/21/2013    Lab Results  Component Value Date   CREATININE 0.61 12/21/2013    No results found for: PSA  No results found for: TESTOSTERONE  No results found for: HGBA1C  Lab Results  Component Value Date   TSH 0.247 (L) 12/21/2013    No results found for: CHOL, HDL, CHOLHDL, VLDL, LDLCALC  Lab Results  Component Value Date   AST 22 12/20/2013   Lab Results  Component Value Date   ALT 18 12/20/2013   No components found for: ALKALINEPHOPHATASE No components found for: BILIRUBINTOTAL  No results found for: ESTRADIOL  I have reviewed the labs.  Assessment & Plan:   1. Urethral prolapse Resolved  Continue vaginal estrogen cream RTC in one year for exam  Return in about 1 year (around 02/22/2019) for exam.  These notes generated with voice recognition software. I apologize for typographical errors.  Michiel CowboySHANNON Cuthbert Turton, PA-C  [facility] [address] [place], [place] [address] [phone]

## 2018-02-21 ENCOUNTER — Other Ambulatory Visit: Payer: Self-pay

## 2018-02-21 ENCOUNTER — Ambulatory Visit (INDEPENDENT_AMBULATORY_CARE_PROVIDER_SITE_OTHER): Payer: Medicare Other | Admitting: Urology

## 2018-02-21 ENCOUNTER — Encounter: Payer: Self-pay | Admitting: Urology

## 2018-02-21 VITALS — BP 122/82 | HR 63 | Ht 62.0 in | Wt 130.3 lb

## 2018-02-21 DIAGNOSIS — N368 Other specified disorders of urethra: Secondary | ICD-10-CM

## 2019-02-26 ENCOUNTER — Ambulatory Visit: Payer: Medicare Other | Admitting: Urology

## 2019-02-26 DIAGNOSIS — N950 Postmenopausal bleeding: Secondary | ICD-10-CM | POA: Insufficient documentation

## 2019-02-26 NOTE — Progress Notes (Deleted)
02/27/2019 10:09 AM   Carrie [name] 27-Jan-1930 [id number]  Referring provider: Derinda Late, MD [phone] [facility] [place],  [place] [address]  No chief complaint on file.   HPI: Carrie [name] is an 85 year old female with a history of an urethral prolapse and a history of gross hematuria who presents today for follow up with her daughter-in-law, Carrie [name].    PMH: Past Medical History:  Diagnosis Date  . Allergic rhinitis   . Anemia   . Congestive heart failure (CHF) ([place])   . Diverticulosis   . Hyperlipidemia   . Osteoporosis   . Recurrent UTI     Surgical History: Past Surgical History:  Procedure Laterality Date  . ROTATOR CUFF REPAIR Right     Home Medications:  Allergies as of 02/27/2019      Reactions   Azithromycin Other (See Comments)   diarrhea   Macrobid [nitrofurantoin Monohyd Macro] Rash   Nitrofurantoin Rash      Medication List       Accurate as of February 26, 2019 10:09 AM. If you have any questions, ask your nurse or doctor.        aspirin 81 MG EC tablet Take 81 mg by mouth daily. Swallow whole.   CALCIUM ANTACID PO Take by mouth.   Cranberry 1000 MG Caps Take by mouth.   mesalamine 1.2 g EC tablet Commonly known as: LIALDA   methocarbamol 500 MG tablet Commonly known as: ROBAXIN Take 500 mg by mouth 4 (four) times daily.   multivitamin tablet Take 1 tablet by mouth daily.   raloxifene 60 MG tablet Commonly known as: EVISTA Take 60 mg by mouth daily.   sodium chloride 2 % ophthalmic solution Commonly known as: MURO 128 1 drop.   vitamin C 250 MG tablet Commonly known as: ASCORBIC ACID Take 250 mg by mouth daily.   VITAMIN E (TOPICAL) Crea Apply topically.       Allergies:  Allergies  Allergen Reactions  . Azithromycin Other (See Comments)    diarrhea   . Macrobid [Nitrofurantoin Monohyd Macro] Rash  . Nitrofurantoin Rash    Family History: Family History   Problem Relation Age of Onset  . Bladder Cancer Neg Hx   . Kidney cancer Neg Hx     Social History:  reports that she has never smoked. She has never used smokeless tobacco. She reports that she does not drink alcohol or use drugs.  ROS:                                        Physical Exam: There were no vitals taken for this visit.  Constitutional:  Well nourished. Alert and oriented, No acute distress. HEENT: [place] AT, moist mucus membranes.  Trachea midline, no masses. Cardiovascular: No clubbing, cyanosis, or edema. Respiratory: Normal respiratory effort, no increased work of breathing. GI: Abdomen is soft, non tender, non distended, no abdominal masses. Liver and spleen not palpable.  No hernias appreciated.  Stool sample for occult testing is not indicated.   GU: No CVA tenderness.  No bladder fullness or masses.  *** external genitalia, *** pubic hair distribution, no lesions.  Normal urethral meatus, no lesions, no prolapse, no discharge.   No urethral masses, tenderness and/or tenderness. No bladder fullness, tenderness or masses. *** vagina mucosa, *** estrogen effect, no discharge,  no lesions, *** pelvic support, *** cystocele and *** rectocele noted.  No cervical motion tenderness.  Uterus is freely mobile and non-fixed.  No adnexal/parametria masses or tenderness noted.  Anus and perineum are without rashes or lesions.   ***  Skin: No rashes, bruises or suspicious lesions. Lymph: No cervical or inguinal adenopathy. Neurologic: Grossly intact, no focal deficits, moving all 4 extremities. Psychiatric: Normal mood and affect.   Laboratory Data: Lab Results  Component Value Date   WBC 15.3 (H) 12/21/2013   HGB 8.6 (L) 12/21/2013   HCT 26.2 (L) 12/21/2013   MCV 92 12/21/2013   PLT 403 12/21/2013    Lab Results  Component Value Date   CREATININE 0.61 12/21/2013    No results found for: PSA  No results found for: TESTOSTERONE  No results found  for: HGBA1C  Lab Results  Component Value Date   TSH 0.247 (L) 12/21/2013    No results found for: CHOL, HDL, CHOLHDL, VLDL, LDLCALC  Lab Results  Component Value Date   AST 22 12/20/2013   Lab Results  Component Value Date   ALT 18 12/20/2013   No components found for: ALKALINEPHOPHATASE No components found for: BILIRUBINTOTAL  No results found for: ESTRADIOL  I have reviewed the labs.  Assessment & Plan:   1. Urethral prolapse Resolved  Continue vaginal estrogen cream RTC in one year for exam    No follow-ups on file.  These notes generated with voice recognition software. I apologize for typographical errors.  Michiel CowboySHANNON Keefer Soulliere, PA-C  [facility] [address] [place], [place] [address] [phone]

## 2019-02-27 ENCOUNTER — Ambulatory Visit: Payer: Medicare Other | Admitting: Urology

## 2019-10-29 ENCOUNTER — Other Ambulatory Visit: Payer: Self-pay | Admitting: Neurology

## 2019-10-29 DIAGNOSIS — R413 Other amnesia: Secondary | ICD-10-CM

## 2019-10-30 ENCOUNTER — Other Ambulatory Visit: Payer: Self-pay | Admitting: Neurology

## 2019-10-30 DIAGNOSIS — R413 Other amnesia: Secondary | ICD-10-CM

## 2019-11-05 ENCOUNTER — Ambulatory Visit
Admission: RE | Admit: 2019-11-05 | Discharge: 2019-11-05 | Disposition: A | Discharge status: Home / Self Care | Payer: Medicare Other | Source: Ambulatory Visit | Attending: Neurology | Admitting: Neurology

## 2019-11-05 ENCOUNTER — Other Ambulatory Visit: Payer: Self-pay

## 2019-11-05 DIAGNOSIS — G43719 Chronic migraine without aura, intractable, without status migrainosus: Secondary | ICD-10-CM | POA: Insufficient documentation

## 2019-11-05 DIAGNOSIS — M4802 Spinal stenosis, cervical region: Secondary | ICD-10-CM | POA: Insufficient documentation

## 2019-11-05 DIAGNOSIS — Z8673 Personal history of transient ischemic attack (TIA), and cerebral infarction without residual deficits: Secondary | ICD-10-CM | POA: Insufficient documentation

## 2019-11-05 DIAGNOSIS — R413 Other amnesia: Secondary | ICD-10-CM

## 2021-09-09 DEATH — deceased
